# Patient Record
Sex: Male | Born: 1950 | Race: White | Hispanic: No | Marital: Married | Smoking: Never smoker
Health system: Southern US, Community
[De-identification: ages and names within clinical notes are randomized; demographics above are authoritative.]

## PROBLEM LIST (undated history)

## (undated) DIAGNOSIS — Z299 Encounter for prophylactic measures, unspecified: Secondary | ICD-10-CM

## (undated) DIAGNOSIS — O2230 Deep phlebothrombosis in pregnancy, unspecified trimester: Secondary | ICD-10-CM

## (undated) DIAGNOSIS — Z98811 Dental restoration status: Secondary | ICD-10-CM

## (undated) DIAGNOSIS — M2241 Chondromalacia patellae, right knee: Secondary | ICD-10-CM

## (undated) DIAGNOSIS — S83249A Other tear of medial meniscus, current injury, unspecified knee, initial encounter: Secondary | ICD-10-CM

## (undated) DIAGNOSIS — I82409 Acute embolism and thrombosis of unspecified deep veins of unspecified lower extremity: Secondary | ICD-10-CM

## (undated) DIAGNOSIS — I890 Lymphedema, not elsewhere classified: Secondary | ICD-10-CM

## (undated) DIAGNOSIS — IMO0001 Reserved for inherently not codable concepts without codable children: Secondary | ICD-10-CM

## (undated) DIAGNOSIS — Z960 Presence of urogenital implants: Secondary | ICD-10-CM

## (undated) DIAGNOSIS — M1990 Unspecified osteoarthritis, unspecified site: Secondary | ICD-10-CM

## (undated) DIAGNOSIS — C801 Malignant (primary) neoplasm, unspecified: Secondary | ICD-10-CM

## (undated) DIAGNOSIS — N400 Enlarged prostate without lower urinary tract symptoms: Secondary | ICD-10-CM

## (undated) HISTORY — PX: COLONOSCOPY: SHX174

## (undated) HISTORY — PX: HERNIA REPAIR: SHX51

## (undated) HISTORY — PX: SCROTAL EXPLORATION: SHX2386

## (undated) HISTORY — PX: PROSTATECTOMY: SHX69

## (undated) HISTORY — PX: URINARY SPHINCTER IMPLANT: SHX2624

## (undated) HISTORY — PX: VASECTOMY: SHX75

---

## 2002-08-29 ENCOUNTER — Ambulatory Visit (HOSPITAL_COMMUNITY): Admission: RE | Admit: 2002-08-29 | Discharge: 2002-08-29 | Payer: Self-pay | Admitting: Gastroenterology

## 2008-02-28 ENCOUNTER — Ambulatory Visit (HOSPITAL_COMMUNITY): Admission: RE | Admit: 2008-02-28 | Discharge: 2008-02-28 | Payer: Self-pay | Admitting: Gastroenterology

## 2011-03-25 NOTE — Op Note (Signed)
   NAME:  Isaiah Todd, Isaiah Todd                         ACCOUNT NO.:  [id number]   MEDICAL RECORD NO.:  [id number]                   PATIENT TYPE:  AMB   LOCATION:  ENDO                                 FACILITY:  [facility]   PHYSICIAN:  Danise Edge, M.D.                DATE OF BIRTH:  [birth year]   DATE OF PROCEDURE:  08/29/2002  DATE OF DISCHARGE:                                 OPERATIVE REPORT   PROCEDURE PERFORMED:  Colonoscopy.   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  His father died of adenocarcinoma of unknown primary.  His father underwent colonoscopic exams to remove colon polyps.  There was  no definite family history of colon cancer.   ENDOSCOPIST:  Danise Edge, M.D.   PREMEDICATION:  Versed 10 mg, Fentanyl 50 mcg   ENDOSCOPE:  Olympus pediatric colonoscope.   DESCRIPTION OF PROCEDURE:  After obtaining informed consent, the patient was  placed in the left lateral decubitus position.  I administered intravenous  Fentanyl and intravenous Versed to achieve conscious sedation for the  procedure.  The patient's blood pressure, oxygen saturation, and cardiac  rhythm were monitored throughout the procedure and documented in the medical  record.   Anal inspection was normal.  Digital rectal exam revealed a nonnodular  prostate.  The Olympus pediatric video colonoscope was introduced into the  rectum and easily advanced to the cecum.  Colonic preparation for the exam  today was excellent.  The patient consumed the MiraLax - Gatorade colonic  prep.   RECTUM:  Normal.   SIGMOID COLON AND DESCENDING COLON:  Normal.   SPLENIC FLEXURE:  Normal.   TRANSVERSE COLON:  Normal.   HEPATIC FLEXURE:  Normal.   ASCENDING COLON:  Normal.   CECUM AND ILEOCECAL VALVE:  Normal.    ASSESSMENT:  Normal screening proctocolonoscopy to the cecum.  No endoscopic  evidence for the presence of colorectal neoplasia.                                          Danise Edge, M.D.    MJ/MEDQ  D:  08/29/2002  T:  08/29/2002  Job:  [id number]   cc:   Hal T. Stoneking, M.D.  [address] [place], [place] [address]  Fax: [phone]

## 2015-03-30 ENCOUNTER — Other Ambulatory Visit: Payer: Self-pay | Admitting: Physician Assistant

## 2015-04-07 ENCOUNTER — Encounter (HOSPITAL_BASED_OUTPATIENT_CLINIC_OR_DEPARTMENT_OTHER): Payer: Self-pay | Admitting: *Deleted

## 2015-04-08 NOTE — H&P (Signed)
[facility] [address] [place], [place] [address] [phone] A Division of [facility]  Ninetta Lights, M.D.   Robert A. Noemi Chapel, M.D.   Faythe Casa, M.D.   Johnny Bridge, M.D.   Almedia Balls, M.D. Ernesta Amble. Percell Miller, M.D.  Joseph Pierini, M.D.  Lanier Prude, M.D.    Verner Chol, M.D. Lovett Calender, PA- C  Mary L. Venida Jarvis, PA-C  Kirstin A. Shepperson, PA-C  Josh Pinesdale, PA-C  [place], [place]  RE: Isaiah Todd, [place]   [id number]      DOB: 12-09-1950 INITIAL EVALUATION:  03-17-15  HPI: The patient is a 64 year old, new patient to the office today presenting with left knee pain.  He was referred by Dr. Lajean Manes from [place] for left knee pain.  This has been going on for approximately 5 months now with no provoking injury or trauma.  He does report a day in December when he was climbing a ladder multiple times and potentially twisted his knee at that time.  He currently describes symptoms of intermittent swelling,  popping, catching, locking, and giving way.  Most of his pain is throbbing in nature.  He has been treating with anti-inflammatories up to 600 mg. q.d. with no significant relief of his pain but mild improvement.  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    The patient's past medical history is significant for BPH, erectile dysfunction, inguinal hernia repair, and previous history of back pain.  Medications include finasteride, Ibuprofen and Cialis.  Family history is negative for diabetes, positive for hypertension, negative for heart disease.  The patient is a nonsmoker, occasional drinker and is retired.    EXAMINATION: The patient is 5'6", 205 pounds, blood pressure 131/90, P 65.  Examination of his left knee reveals medial joint line tenderness to palpation.  No lateral joint line tenderness.  No significant retropatellar grind.  Range of motion  between 0 and 120 degrees.  Ligamentous structures are intact with solid end-points.  Positive McMurray's and Thessaly's concerning for meniscal pathology and internal derangement.  No effusion or erythema present on exam.  Neurovascularly intact distally.  Muscle function is intact with normal knee flexion and extension.    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     DISPOSITION: Discussed at length with the patient that based on his clinical history and exam of mechanical   Continued----- [facility] [address] [place], [place] [id number] [phone] A Division of [facility]  Ninetta Lights, M.D.   Robert A. Noemi Chapel, M.D.   Faythe Casa, M.D.   Johnny Bridge, M.D.   Almedia Balls, M.D. Ernesta Amble. Percell Miller, M.D.  Joseph Pierini, M.D.  Lanier Prude, M.D.    Verner Chol, M.D. Lovett Calender, PA- C  Mary L. Venida Jarvis, PA-C  Kirstin A. Shepperson, PA-C  Josh Yoncalla, PA-C  [place], [place]  RE: Isaiah Todd, Isaiah Todd   [id number]      DOB: 1951-10-15 INITIAL EVALUATION:  03-17-15 Page 2  symptoms with constant left knee pain as well as intermittent effusions and signs of internal derangement on exam, we suspect he has a meniscus tear that we can further classify with  MRI.  The patient will also be scheduled for surgery and will proceed based on MRI results.  The patient was agreeable with this plan. He was provided with appropriate counseling about surgical intervention.    PLEASE CALL MRI RESULTS TO PATIENT AT [phone].   Ninetta Lights, M.D.  Dictated by:  Birdie Hopes, DO   Electronically verified by Ninetta Lights, M.D.  DFM(DD):gde D 03-17-15 T 03-18-15 Cc:  Lajean Manes, MD fax [phone]

## 2015-04-09 ENCOUNTER — Ambulatory Visit (HOSPITAL_BASED_OUTPATIENT_CLINIC_OR_DEPARTMENT_OTHER): Payer: 59 | Admitting: Anesthesiology

## 2015-04-09 ENCOUNTER — Ambulatory Visit (HOSPITAL_BASED_OUTPATIENT_CLINIC_OR_DEPARTMENT_OTHER)
Admission: RE | Admit: 2015-04-09 | Discharge: 2015-04-09 | Disposition: A | Discharge status: Home / Self Care | Payer: 59 | Source: Ambulatory Visit | Attending: Orthopedic Surgery | Admitting: Orthopedic Surgery

## 2015-04-09 ENCOUNTER — Encounter (HOSPITAL_BASED_OUTPATIENT_CLINIC_OR_DEPARTMENT_OTHER): Payer: Self-pay | Admitting: Anesthesiology

## 2015-04-09 ENCOUNTER — Encounter (HOSPITAL_BASED_OUTPATIENT_CLINIC_OR_DEPARTMENT_OTHER)
Admission: RE | Disposition: A | Discharge status: Home / Self Care | Payer: Self-pay | Source: Ambulatory Visit | Attending: Orthopedic Surgery

## 2015-04-09 DIAGNOSIS — M23232 Derangement of other medial meniscus due to old tear or injury, left knee: Secondary | ICD-10-CM | POA: Diagnosis not present

## 2015-04-09 DIAGNOSIS — M2242 Chondromalacia patellae, left knee: Secondary | ICD-10-CM | POA: Insufficient documentation

## 2015-04-09 DIAGNOSIS — N400 Enlarged prostate without lower urinary tract symptoms: Secondary | ICD-10-CM | POA: Diagnosis not present

## 2015-04-09 DIAGNOSIS — E669 Obesity, unspecified: Secondary | ICD-10-CM | POA: Diagnosis not present

## 2015-04-09 DIAGNOSIS — Z6833 Body mass index (BMI) 33.0-33.9, adult: Secondary | ICD-10-CM | POA: Insufficient documentation

## 2015-04-09 DIAGNOSIS — M23204 Derangement of unspecified medial meniscus due to old tear or injury, left knee: Secondary | ICD-10-CM | POA: Diagnosis present

## 2015-04-09 HISTORY — DX: Benign prostatic hyperplasia without lower urinary tract symptoms: N40.0

## 2015-04-09 HISTORY — PX: CHONDROPLASTY: SHX5177

## 2015-04-09 HISTORY — PX: KNEE ARTHROSCOPY WITH MEDIAL MENISECTOMY: SHX5651

## 2015-04-09 LAB — POCT HEMOGLOBIN-HEMACUE: Hemoglobin: 16.1 g/dL (ref 13.0–17.0)

## 2015-04-09 SURGERY — ARTHROSCOPY, KNEE, WITH MEDIAL MENISCECTOMY
Anesthesia: General | Site: Knee | Laterality: Left

## 2015-04-09 MED ORDER — KETOROLAC TROMETHAMINE 30 MG/ML IJ SOLN
INTRAMUSCULAR | Status: DC | PRN
  Administered 2015-04-09: 30 mg via INTRAVENOUS

## 2015-04-09 MED ORDER — LACTATED RINGERS IV SOLN
INTRAVENOUS | Status: DC
  Administered 2015-04-09 (×2): via INTRAVENOUS

## 2015-04-09 MED ORDER — ONDANSETRON HCL 4 MG PO TABS: 4.0000 mg | ORAL_TABLET | Freq: Three times a day (TID) | ORAL | Status: DC | PRN

## 2015-04-09 MED ORDER — ACETAMINOPHEN-CODEINE #3 300-30 MG PO TABS: 1.0000 | ORAL_TABLET | ORAL | Status: DC | PRN

## 2015-04-09 MED ORDER — FENTANYL CITRATE (PF) 100 MCG/2ML IJ SOLN
INTRAMUSCULAR | Status: DC | PRN
  Administered 2015-04-09 (×3): 50 ug via INTRAVENOUS

## 2015-04-09 MED ORDER — METHYLPREDNISOLONE ACETATE 80 MG/ML IJ SUSP
INTRAMUSCULAR | Status: AC
  Filled 2015-04-09: qty 1

## 2015-04-09 MED ORDER — LIDOCAINE HCL (CARDIAC) 20 MG/ML IV SOLN
INTRAVENOUS | Status: DC | PRN
  Administered 2015-04-09: 50 mg via INTRAVENOUS

## 2015-04-09 MED ORDER — HYDROMORPHONE HCL 1 MG/ML IJ SOLN: 0.2500 mg | INTRAMUSCULAR | Status: DC | PRN

## 2015-04-09 MED ORDER — MIDAZOLAM HCL 5 MG/5ML IJ SOLN
INTRAMUSCULAR | Status: DC | PRN
  Administered 2015-04-09: 2 mg via INTRAVENOUS

## 2015-04-09 MED ORDER — PROPOFOL 500 MG/50ML IV EMUL
INTRAVENOUS | Status: AC
  Filled 2015-04-09: qty 50

## 2015-04-09 MED ORDER — FENTANYL CITRATE (PF) 100 MCG/2ML IJ SOLN
INTRAMUSCULAR | Status: AC
  Filled 2015-04-09: qty 6

## 2015-04-09 MED ORDER — SODIUM CHLORIDE 0.9 % IR SOLN
Status: DC | PRN
  Administered 2015-04-09: 4500 mL

## 2015-04-09 MED ORDER — METHYLPREDNISOLONE ACETATE 80 MG/ML IJ SUSP
INTRAMUSCULAR | Status: DC | PRN
  Administered 2015-04-09: 80 mg

## 2015-04-09 MED ORDER — OXYCODONE HCL 5 MG/5ML PO SOLN: 5.0000 mg | Freq: Once | ORAL | Status: DC | PRN

## 2015-04-09 MED ORDER — PROPOFOL 10 MG/ML IV BOLUS
INTRAVENOUS | Status: DC | PRN
  Administered 2015-04-09: 200 mg via INTRAVENOUS

## 2015-04-09 MED ORDER — PROPOFOL 10 MG/ML IV BOLUS
INTRAVENOUS | Status: AC
  Filled 2015-04-09: qty 80

## 2015-04-09 MED ORDER — CEFAZOLIN SODIUM-DEXTROSE 2-3 GM-% IV SOLR
INTRAVENOUS | Status: AC
  Filled 2015-04-09: qty 50

## 2015-04-09 MED ORDER — LACTATED RINGERS IV SOLN
INTRAVENOUS | Status: DC
  Administered 2015-04-09: 08:00:00 via INTRAVENOUS

## 2015-04-09 MED ORDER — BUPIVACAINE HCL (PF) 0.5 % IJ SOLN
INTRAMUSCULAR | Status: AC
  Filled 2015-04-09: qty 30

## 2015-04-09 MED ORDER — ONDANSETRON HCL 4 MG/2ML IJ SOLN: 4.0000 mg | Freq: Four times a day (QID) | INTRAMUSCULAR | Status: DC | PRN

## 2015-04-09 MED ORDER — ONDANSETRON HCL 4 MG/2ML IJ SOLN
INTRAMUSCULAR | Status: DC | PRN
  Administered 2015-04-09: 4 mg via INTRAVENOUS

## 2015-04-09 MED ORDER — BUPIVACAINE HCL (PF) 0.5 % IJ SOLN
INTRAMUSCULAR | Status: DC | PRN
  Administered 2015-04-09: 20 mL via INTRA_ARTICULAR

## 2015-04-09 MED ORDER — MIDAZOLAM HCL 2 MG/2ML IJ SOLN
1.0000 mg | INTRAMUSCULAR | Status: DC | PRN
Start: 2015-04-09 — End: 2015-04-09

## 2015-04-09 MED ORDER — GLYCOPYRROLATE 0.2 MG/ML IJ SOLN: 0.2000 mg | Freq: Once | INTRAMUSCULAR | Status: DC | PRN

## 2015-04-09 MED ORDER — MIDAZOLAM HCL 2 MG/2ML IJ SOLN
INTRAMUSCULAR | Status: AC
  Filled 2015-04-09: qty 2

## 2015-04-09 MED ORDER — CEFAZOLIN SODIUM-DEXTROSE 2-3 GM-% IV SOLR
2.0000 g | INTRAVENOUS | Status: AC
  Administered 2015-04-09: 2 g via INTRAVENOUS

## 2015-04-09 MED ORDER — FENTANYL CITRATE (PF) 100 MCG/2ML IJ SOLN: 50.0000 ug | INTRAMUSCULAR | Status: DC | PRN

## 2015-04-09 MED ORDER — CHLORHEXIDINE GLUCONATE 4 % EX LIQD: 60.0000 mL | Freq: Once | CUTANEOUS | Status: DC

## 2015-04-09 MED ORDER — DEXAMETHASONE SODIUM PHOSPHATE 4 MG/ML IJ SOLN
INTRAMUSCULAR | Status: DC | PRN
  Administered 2015-04-09: 10 mg via INTRAVENOUS

## 2015-04-09 MED ORDER — OXYCODONE HCL 5 MG PO TABS: 5.0000 mg | ORAL_TABLET | Freq: Once | ORAL | Status: DC | PRN

## 2015-04-09 SURGICAL SUPPLY — 39 items
BANDAGE ELASTIC 6 VELCRO ST LF (GAUZE/BANDAGES/DRESSINGS) ×2 IMPLANT
BLADE CUDA 5.5 (BLADE) IMPLANT
BLADE CUDA GRT WHITE 3.5 (BLADE) IMPLANT
BLADE CUTTER GATOR 3.5 (BLADE) ×2 IMPLANT
BLADE CUTTER MENIS 5.5 (BLADE) IMPLANT
BLADE GREAT WHITE 4.2 (BLADE) ×2 IMPLANT
BUR OVAL 4.0 (BURR) IMPLANT
CUTTER MENISCUS  4.2MM (BLADE)
CUTTER MENISCUS 4.2MM (BLADE) IMPLANT
DRAPE ARTHROSCOPY W/POUCH 90 (DRAPES) ×2 IMPLANT
DURAPREP 26ML APPLICATOR (WOUND CARE) ×2 IMPLANT
ELECT MENISCUS 165MM 90D (ELECTRODE) IMPLANT
ELECT REM PT RETURN 9FT ADLT (ELECTROSURGICAL)
ELECTRODE REM PT RTRN 9FT ADLT (ELECTROSURGICAL) IMPLANT
GAUZE SPONGE 4X4 12PLY STRL (GAUZE/BANDAGES/DRESSINGS) ×4 IMPLANT
GAUZE XEROFORM 1X8 LF (GAUZE/BANDAGES/DRESSINGS) ×2 IMPLANT
GLOVE BIOGEL PI IND STRL 7.0 (GLOVE) ×3 IMPLANT
GLOVE BIOGEL PI INDICATOR 7.0 (GLOVE) ×3
GLOVE ECLIPSE 6.5 STRL STRAW (GLOVE) ×2 IMPLANT
GLOVE ECLIPSE 7.0 STRL STRAW (GLOVE) ×2 IMPLANT
GLOVE ORTHO TXT STRL SZ7.5 (GLOVE) IMPLANT
GLOVE SURG ORTHO 8.0 STRL STRW (GLOVE) ×2 IMPLANT
GOWN STRL REUS W/ TWL LRG LVL3 (GOWN DISPOSABLE) ×2 IMPLANT
GOWN STRL REUS W/ TWL XL LVL3 (GOWN DISPOSABLE) ×1 IMPLANT
GOWN STRL REUS W/TWL LRG LVL3 (GOWN DISPOSABLE) ×2
GOWN STRL REUS W/TWL XL LVL3 (GOWN DISPOSABLE) ×1
HOLDER KNEE FOAM BLUE (MISCELLANEOUS) ×2 IMPLANT
IV NS IRRIG 3000ML ARTHROMATIC (IV SOLUTION) ×8 IMPLANT
KNEE WRAP E Z 3 GEL PACK (MISCELLANEOUS) ×2 IMPLANT
MANIFOLD NEPTUNE II (INSTRUMENTS) ×2 IMPLANT
PACK ARTHROSCOPY DSU (CUSTOM PROCEDURE TRAY) ×2 IMPLANT
PACK BASIN DAY SURGERY FS (CUSTOM PROCEDURE TRAY) ×2 IMPLANT
PENCIL BUTTON HOLSTER BLD 10FT (ELECTRODE) IMPLANT
SET ARTHROSCOPY TUBING (MISCELLANEOUS) ×1
SET ARTHROSCOPY TUBING LN (MISCELLANEOUS) ×1 IMPLANT
SUT ETHILON 3 0 PS 1 (SUTURE) ×2 IMPLANT
SUT VIC AB 3-0 FS2 27 (SUTURE) IMPLANT
TOWEL OR 17X24 6PK STRL BLUE (TOWEL DISPOSABLE) ×2 IMPLANT
WATER STERILE IRR 1000ML POUR (IV SOLUTION) ×2 IMPLANT

## 2015-04-09 NOTE — Anesthesia Procedure Notes (Signed)
Procedure Name: LMA Insertion Date/Time: 04/09/2015 8:27 AM Performed by: Toula Moos L Pre-anesthesia Checklist: Patient identified, Emergency Drugs available, Suction available, Patient being monitored and Timeout performed Patient Re-evaluated:Patient Re-evaluated prior to inductionOxygen Delivery Method: Circle System Utilized Preoxygenation: Pre-oxygenation with 100% oxygen Intubation Type: IV induction Ventilation: Mask ventilation without difficulty LMA: LMA inserted LMA Size: 5.0 Number of attempts: 1 Airway Equipment and Method: Bite block Placement Confirmation: positive ETCO2 Tube secured with: Tape Dental Injury: Teeth and Oropharynx as per pre-operative assessment

## 2015-04-09 NOTE — Interval H&P Note (Signed)
History and Physical Interval Note:  04/09/2015 7:38 AM  Isaiah Todd  has presented today for surgery, with the diagnosis of CHONDROMALACIA PATELLAE LEFT KNEE OTHER MENISCUS DERANGEMENTS POSTERIOR HORN OF MEDIAL MENISCUS LEFT KNEE OTHER TEAR OF LATERAL MENSICUS CURRENT INJURY UNSPECIFIED KNEE INITIAL   The various methods of treatment have been discussed with the patient and family. After consideration of risks, benefits and other options for treatment, the patient has consented to  Procedure(s): LEFT KNEE ARTHROSCOPY CHONDROPLASTY, MEDIAL AND LATERAL  MENISCECTOMY (Left) as a surgical intervention .  The patient's history has been reviewed, patient examined, no change in status, stable for surgery.  I have reviewed the patient's chart and labs.  Questions were answered to the patient's satisfaction.     Jamica Woodyard F

## 2015-04-09 NOTE — Anesthesia Postprocedure Evaluation (Signed)
Anesthesia Post Note  Patient: Isaiah Todd  Procedure(s) Performed: Procedure(s) (LRB): LEFT KNEE ARTHROSCOPY CHONDROPLASTY, PARIAL MEDIAL   MENISCECTOMY (Left) CHONDROPLASTY (Left)  Anesthesia type: General  Patient location: PACU  Post pain: Pain level controlled and Adequate analgesia  Post assessment: Post-op Vital signs reviewed, Patient's Cardiovascular Status Stable, Respiratory Function Stable, Patent Airway and Pain level controlled  Last Vitals:  Filed Vitals:   04/09/15 0945  BP: 111/64  Pulse:   Temp:   Resp:     Post vital signs: Reviewed and stable  Level of consciousness: awake, alert  and oriented  Complications: No apparent anesthesia complications

## 2015-04-09 NOTE — Transfer of Care (Signed)
Immediate Anesthesia Transfer of Care Note  Patient: Isaiah Todd  Procedure(s) Performed: Procedure(s): LEFT KNEE ARTHROSCOPY CHONDROPLASTY, PARIAL MEDIAL   MENISCECTOMY (Left) CHONDROPLASTY (Left)  Patient Location: PACU  Anesthesia Type:General  Level of Consciousness: sedated  Airway & Oxygen Therapy: Patient Spontanous Breathing and Patient connected to face mask oxygen  Post-op Assessment: Report given to RN and Post -op Vital signs reviewed and stable  Post vital signs: Reviewed and stable  Last Vitals:  Filed Vitals:   04/09/15 0723  BP: 133/82  Pulse: 67  Temp: 36.7 C  Resp: 20    Complications: No apparent anesthesia complications

## 2015-04-09 NOTE — Discharge Instructions (Signed)

## 2015-04-09 NOTE — Anesthesia Preprocedure Evaluation (Signed)
Anesthesia Evaluation  Patient identified by MRN, date of birth, ID band Patient awake    Reviewed: Allergy & Precautions, NPO status , Patient's Chart, lab work & pertinent test results  Airway Mallampati: II   Neck ROM: full    Dental   Pulmonary neg pulmonary ROS,  breath sounds clear to auscultation        Cardiovascular negative cardio ROS  Rhythm:regular Rate:Normal     Neuro/Psych    GI/Hepatic   Endo/Other  obese  Renal/GU    BPH    Musculoskeletal   Abdominal   Peds  Hematology   Anesthesia Other Findings   Reproductive/Obstetrics                             Anesthesia Physical Anesthesia Plan  ASA: II  Anesthesia Plan: General   Post-op Pain Management:    Induction: Intravenous  Airway Management Planned: LMA  Additional Equipment:   Intra-op Plan:   Post-operative Plan:   Informed Consent: I have reviewed the patients History and Physical, chart, labs and discussed the procedure including the risks, benefits and alternatives for the proposed anesthesia with the patient or authorized representative who has indicated his/her understanding and acceptance.     Plan Discussed with: CRNA, Anesthesiologist and Surgeon  Anesthesia Plan Comments:         Anesthesia Quick Evaluation

## 2015-04-10 ENCOUNTER — Encounter (HOSPITAL_BASED_OUTPATIENT_CLINIC_OR_DEPARTMENT_OTHER): Payer: Self-pay | Admitting: Orthopedic Surgery

## 2015-04-10 NOTE — Op Note (Signed)
NAMESEMISI, BIELA NO.:  [id number]  MEDICAL RECORD NO.:  [id number]  LOCATION:                                FACILITY:  [facility]  PHYSICIAN:  Ninetta Lights, M.D. DATE OF BIRTH:  05/28/1951  DATE OF PROCEDURE:  04/09/2015 DATE OF DISCHARGE:  04/09/2015                              OPERATIVE REPORT   PREOPERATIVE DIAGNOSIS:  Left knee medial meniscus tear.  POSTOPERATIVE DIAGNOSIS:  Left knee medial meniscus tear, complex tear medial meniscus.  Some focal grade 3 changes medial femoral condyle, grade 2 patellofemoral joint.  PROCEDURE:  Left knee exam under anesthesia, arthroscopy.  Chondroplasty patella medial femoral condyle.  Partial medial meniscectomy.  SURGEON:  Ninetta Lights, M.D.  ASSISTANT:  Elmyra Ricks, P.A.  ANESTHESIA:  General.  BLOOD LOSS:  Minimal.  SPECIMENS:  None.  CULTURES:  None.  COMPLICATIONS:  None.  DRESSINGS:  Soft compressive.  TOURNIQUET:  Not employed.  PROCEDURE:  The patient was brought to the operating room, placed on the operating room table in a supine position.  After adequate anesthesia had been obtained, leg holder applied.  Leg prepped and draped in usual sterile fashion.  Two portals, one each medial and lateral, parapatellar.  Arthroscope introduced, knee distended and inspected. Good patellar tracking.  Some mild grade 2 changes debrided.  ACL intact.  Lateral meniscus and lateral compartment looked good.  Medial meniscus extensive complex tearing.  Focal area of grade 2 and 3 changes on the condyle juxtaposed to the posterior horn tear.  After taking the meniscus out to a stable rim, tapered in smoothly, I then did a chondroplasty of the condyle.  Numerous small chondral loose bodies were removed.  The entire knee examined.  No other findings were appreciated.  Instruments were fully removed.  Portals were closed with nylon.  Knee injected intra- articularly with Depo-Medrol and Marcaine.   Anesthesia reversed. Brought to the recovery room.  Tolerated the surgery well.  No complications.     Ninetta Lights, M.D.     DFM/MEDQ  D:  04/09/2015  T:  04/10/2015  Job:  [id number]

## 2015-08-08 DIAGNOSIS — M2241 Chondromalacia patellae, right knee: Secondary | ICD-10-CM

## 2015-08-08 DIAGNOSIS — S83249A Other tear of medial meniscus, current injury, unspecified knee, initial encounter: Secondary | ICD-10-CM

## 2015-08-08 HISTORY — DX: Other tear of medial meniscus, current injury, unspecified knee, initial encounter: S83.249A

## 2015-08-08 HISTORY — DX: Chondromalacia patellae, right knee: M22.41

## 2015-08-18 ENCOUNTER — Other Ambulatory Visit: Payer: Self-pay | Admitting: Physician Assistant

## 2015-08-18 NOTE — H&P (Signed)
This is a pleasant 64 year-old gentleman who presents to our clinic today with right knee pain.  [name] has struggled on and off with intermittent pain, most likely from a degenerative tear, over the past several months.  In the past this has not been bad enough to proceed with further imaging studies or surgical intervention.  However, he notes that on July 22, 2015 he was jumping off his pickup truck when he hyperextended his right knee.  Since then he has had sharp burning pain in the medial aspect of that knee.  He has noted moderate instability, as well as locking and catching as well.  Increased startup stiffness and rest pain and night pain.  He has tried ice, as well as Percocet with minimal relief of symptoms.  Of note, he is status post left knee arthroscopy with debridement of meniscus tear back in June of 2016.  He has done exceptionally well with this.   Past medical, social and family history reviewed in detail on the patient questionnaire and signed.  Review of systems: As detailed in HPI.  All others reviewed and are negative.    EXAMINATION: Well-developed, well-nourished male in no acute distress.  Alert and oriented x 3.  Examination of his left knee reveals range of motion 0-120 degrees.  No joint line tenderness.  Moderate patellofemoral crepitus.  Ligaments are stable.  He is neurovascularly intact distally.  Examination of his right knee reveals range of motion 0-120 degrees.  Trace effusion.  Moderate patellofemoral crepitus.  I can't get him to open at all with valgus or varus stress.  No pain with valgus or varus stress.  He does have medial joint line tenderness and positive medial McMurray.  Negative Lachman.  He is neurovascularly intact distally.    IMPRESSION: 1. Status post left knee arthroscopy. 2. Right knee medial meniscus tear.  PLAN: In regards to [name]'s left knee, we are pleased he is doing so well.  In regards to his right knee, we are going to go ahead and  proceed with an MRI to assess his meniscus.  We are going to call him once this is complete.  We are going to go ahead and fill out paperwork today for right knee arthroscopy.  Risks, benefits and possible complications of surgery have been reviewed.  Rehab and recovery time discussed. All questions answered.  Paperwork complete.  [name], PA-C   Addendum: MRI results reflect medial meniscus tear right knee

## 2015-08-20 ENCOUNTER — Encounter (HOSPITAL_BASED_OUTPATIENT_CLINIC_OR_DEPARTMENT_OTHER): Payer: Self-pay | Admitting: *Deleted

## 2015-08-26 NOTE — Anesthesia Preprocedure Evaluation (Addendum)
Anesthesia Evaluation  Patient identified by MRN, date of birth, ID band Patient awake    Reviewed: Allergy & Precautions, NPO status , Patient's Chart, lab work & pertinent test results  History of Anesthesia Complications Negative for: history of anesthetic complications  Airway Mallampati: II  TM Distance: >3 FB Neck ROM: Full    Dental no notable dental hx. (+) Dental Advisory Given   Pulmonary neg pulmonary ROS,    Pulmonary exam normal breath sounds clear to auscultation       Cardiovascular negative cardio ROS Normal cardiovascular exam Rhythm:Regular Rate:Normal     Neuro/Psych negative neurological ROS  negative psych ROS   GI/Hepatic negative GI ROS, Neg liver ROS,   Endo/Other  obesity  Renal/GU negative Renal ROS  negative genitourinary   Musculoskeletal  (+) Arthritis ,   Abdominal   Peds negative pediatric ROS (+)  Hematology negative hematology ROS (+)   Anesthesia Other Findings   Reproductive/Obstetrics negative OB ROS                            Anesthesia Physical Anesthesia Plan  ASA: II  Anesthesia Plan: General   Post-op Pain Management:    Induction: Intravenous  Airway Management Planned: LMA  Additional Equipment:   Intra-op Plan:   Post-operative Plan: Extubation in OR  Informed Consent: I have reviewed the patients History and Physical, chart, labs and discussed the procedure including the risks, benefits and alternatives for the proposed anesthesia with the patient or authorized representative who has indicated his/her understanding and acceptance.   Dental advisory given  Plan Discussed with: Anesthesiologist and CRNA  Anesthesia Plan Comments:        Anesthesia Quick Evaluation

## 2015-08-27 ENCOUNTER — Ambulatory Visit (HOSPITAL_BASED_OUTPATIENT_CLINIC_OR_DEPARTMENT_OTHER)
Admission: RE | Admit: 2015-08-27 | Discharge: 2015-08-27 | Disposition: A | Discharge status: Home / Self Care | Payer: 59 | Source: Ambulatory Visit | Attending: Orthopedic Surgery | Admitting: Orthopedic Surgery

## 2015-08-27 ENCOUNTER — Ambulatory Visit (HOSPITAL_BASED_OUTPATIENT_CLINIC_OR_DEPARTMENT_OTHER): Payer: 59 | Admitting: Anesthesiology

## 2015-08-27 ENCOUNTER — Encounter (HOSPITAL_BASED_OUTPATIENT_CLINIC_OR_DEPARTMENT_OTHER)
Admission: RE | Disposition: A | Discharge status: Home / Self Care | Payer: Self-pay | Source: Ambulatory Visit | Attending: Orthopedic Surgery

## 2015-08-27 ENCOUNTER — Encounter (HOSPITAL_BASED_OUTPATIENT_CLINIC_OR_DEPARTMENT_OTHER): Payer: Self-pay

## 2015-08-27 DIAGNOSIS — M23303 Other meniscus derangements, unspecified medial meniscus, right knee: Secondary | ICD-10-CM | POA: Insufficient documentation

## 2015-08-27 DIAGNOSIS — S83241A Other tear of medial meniscus, current injury, right knee, initial encounter: Secondary | ICD-10-CM | POA: Diagnosis present

## 2015-08-27 HISTORY — DX: Dental restoration status: Z98.811

## 2015-08-27 HISTORY — DX: Unspecified osteoarthritis, unspecified site: M19.90

## 2015-08-27 HISTORY — DX: Other tear of medial meniscus, current injury, unspecified knee, initial encounter: S83.249A

## 2015-08-27 HISTORY — DX: Chondromalacia patellae, right knee: M22.41

## 2015-08-27 HISTORY — PX: KNEE ARTHROSCOPY WITH MEDIAL MENISECTOMY: SHX5651

## 2015-08-27 SURGERY — ARTHROSCOPY, KNEE, WITH MEDIAL MENISCECTOMY
Anesthesia: General | Site: Knee | Laterality: Right

## 2015-08-27 MED ORDER — METOCLOPRAMIDE HCL 5 MG/ML IJ SOLN: 5.0000 mg | Freq: Three times a day (TID) | INTRAMUSCULAR | Status: DC | PRN

## 2015-08-27 MED ORDER — ONDANSETRON HCL 4 MG/2ML IJ SOLN
INTRAMUSCULAR | Status: DC | PRN
  Administered 2015-08-27: 4 mg via INTRAVENOUS

## 2015-08-27 MED ORDER — METHOCARBAMOL 1000 MG/10ML IJ SOLN: 500.0000 mg | Freq: Four times a day (QID) | INTRAVENOUS | Status: DC | PRN

## 2015-08-27 MED ORDER — KETOROLAC TROMETHAMINE 30 MG/ML IJ SOLN
INTRAMUSCULAR | Status: AC
  Filled 2015-08-27: qty 1

## 2015-08-27 MED ORDER — OXYCODONE-ACETAMINOPHEN 5-325 MG PO TABS: 1.0000 | ORAL_TABLET | ORAL | Status: DC | PRN

## 2015-08-27 MED ORDER — MIDAZOLAM HCL 2 MG/2ML IJ SOLN
INTRAMUSCULAR | Status: AC
  Filled 2015-08-27: qty 4

## 2015-08-27 MED ORDER — METHYLPREDNISOLONE ACETATE 80 MG/ML IJ SUSP
INTRAMUSCULAR | Status: DC | PRN
  Administered 2015-08-27: 80 mg via INTRA_ARTICULAR

## 2015-08-27 MED ORDER — ONDANSETRON HCL 4 MG PO TABS: 4.0000 mg | ORAL_TABLET | Freq: Three times a day (TID) | ORAL | Status: DC | PRN

## 2015-08-27 MED ORDER — MIDAZOLAM HCL 2 MG/2ML IJ SOLN
1.0000 mg | INTRAMUSCULAR | Status: DC | PRN
  Administered 2015-08-27: 2 mg via INTRAVENOUS

## 2015-08-27 MED ORDER — SUCCINYLCHOLINE CHLORIDE 20 MG/ML IJ SOLN
INTRAMUSCULAR | Status: AC
  Filled 2015-08-27: qty 1

## 2015-08-27 MED ORDER — DEXAMETHASONE SODIUM PHOSPHATE 10 MG/ML IJ SOLN
INTRAMUSCULAR | Status: AC
Start: 2015-08-27 — End: 2015-08-27
  Filled 2015-08-27: qty 1

## 2015-08-27 MED ORDER — FENTANYL CITRATE (PF) 100 MCG/2ML IJ SOLN
INTRAMUSCULAR | Status: AC
  Filled 2015-08-27: qty 4

## 2015-08-27 MED ORDER — GLYCOPYRROLATE 0.2 MG/ML IJ SOLN: 0.2000 mg | Freq: Once | INTRAMUSCULAR | Status: DC | PRN

## 2015-08-27 MED ORDER — METOCLOPRAMIDE HCL 5 MG PO TABS: 5.0000 mg | ORAL_TABLET | Freq: Three times a day (TID) | ORAL | Status: DC | PRN

## 2015-08-27 MED ORDER — ONDANSETRON HCL 4 MG PO TABS: 4.0000 mg | ORAL_TABLET | Freq: Four times a day (QID) | ORAL | Status: DC | PRN

## 2015-08-27 MED ORDER — LIDOCAINE HCL (CARDIAC) 20 MG/ML IV SOLN
INTRAVENOUS | Status: DC | PRN
  Administered 2015-08-27: 100 mg via INTRAVENOUS

## 2015-08-27 MED ORDER — SCOPOLAMINE 1 MG/3DAYS TD PT72: 1.0000 | MEDICATED_PATCH | Freq: Once | TRANSDERMAL | Status: DC | PRN

## 2015-08-27 MED ORDER — ONDANSETRON HCL 4 MG/2ML IJ SOLN
INTRAMUSCULAR | Status: AC
  Filled 2015-08-27: qty 2

## 2015-08-27 MED ORDER — DEXAMETHASONE SODIUM PHOSPHATE 4 MG/ML IJ SOLN
INTRAMUSCULAR | Status: DC | PRN
  Administered 2015-08-27: 10 mg via INTRAVENOUS

## 2015-08-27 MED ORDER — LIDOCAINE HCL (CARDIAC) 20 MG/ML IV SOLN
INTRAVENOUS | Status: AC
  Filled 2015-08-27: qty 5

## 2015-08-27 MED ORDER — HYDROCODONE-ACETAMINOPHEN 5-325 MG PO TABS: 1.0000 | ORAL_TABLET | ORAL | Status: DC | PRN

## 2015-08-27 MED ORDER — METHOCARBAMOL 500 MG PO TABS: 500.0000 mg | ORAL_TABLET | Freq: Four times a day (QID) | ORAL | Status: DC | PRN

## 2015-08-27 MED ORDER — BUPIVACAINE HCL (PF) 0.5 % IJ SOLN
INTRAMUSCULAR | Status: DC | PRN
  Administered 2015-08-27: 20 mL via INTRA_ARTICULAR

## 2015-08-27 MED ORDER — PROPOFOL 10 MG/ML IV BOLUS
INTRAVENOUS | Status: DC | PRN
  Administered 2015-08-27: 200 mg via INTRAVENOUS
  Administered 2015-08-27 (×2): 25 mg via INTRAVENOUS

## 2015-08-27 MED ORDER — CEFAZOLIN SODIUM-DEXTROSE 2-3 GM-% IV SOLR
INTRAVENOUS | Status: AC
  Filled 2015-08-27: qty 50

## 2015-08-27 MED ORDER — SODIUM CHLORIDE 0.9 % IR SOLN
Status: DC | PRN
  Administered 2015-08-27: 3000 mL

## 2015-08-27 MED ORDER — ONDANSETRON HCL 4 MG/2ML IJ SOLN: 4.0000 mg | Freq: Once | INTRAMUSCULAR | Status: DC | PRN

## 2015-08-27 MED ORDER — ONDANSETRON HCL 4 MG/2ML IJ SOLN: 4.0000 mg | Freq: Four times a day (QID) | INTRAMUSCULAR | Status: DC | PRN

## 2015-08-27 MED ORDER — FENTANYL CITRATE (PF) 100 MCG/2ML IJ SOLN
25.0000 ug | INTRAMUSCULAR | Status: DC | PRN
  Administered 2015-08-27: 50 ug via INTRAVENOUS

## 2015-08-27 MED ORDER — BUPIVACAINE HCL (PF) 0.5 % IJ SOLN
INTRAMUSCULAR | Status: AC
  Filled 2015-08-27: qty 30

## 2015-08-27 MED ORDER — FENTANYL CITRATE (PF) 100 MCG/2ML IJ SOLN
50.0000 ug | INTRAMUSCULAR | Status: AC | PRN
  Administered 2015-08-27 (×2): 25 ug via INTRAVENOUS
  Administered 2015-08-27: 100 ug via INTRAVENOUS

## 2015-08-27 MED ORDER — PROPOFOL 500 MG/50ML IV EMUL
INTRAVENOUS | Status: AC
  Filled 2015-08-27: qty 50

## 2015-08-27 MED ORDER — FENTANYL CITRATE (PF) 100 MCG/2ML IJ SOLN
INTRAMUSCULAR | Status: AC
  Filled 2015-08-27: qty 2

## 2015-08-27 MED ORDER — CHLORHEXIDINE GLUCONATE 4 % EX LIQD: 60.0000 mL | Freq: Once | CUTANEOUS | Status: DC

## 2015-08-27 MED ORDER — LACTATED RINGERS IV SOLN: INTRAVENOUS | Status: DC

## 2015-08-27 MED ORDER — LACTATED RINGERS IV SOLN
INTRAVENOUS | Status: DC
  Administered 2015-08-27 (×2): via INTRAVENOUS

## 2015-08-27 MED ORDER — KETOROLAC TROMETHAMINE 30 MG/ML IJ SOLN
INTRAMUSCULAR | Status: DC | PRN
Start: 2015-08-27 — End: 2015-08-27
  Administered 2015-08-27: 30 mg via INTRAVENOUS

## 2015-08-27 MED ORDER — METHYLPREDNISOLONE ACETATE 80 MG/ML IJ SUSP
INTRAMUSCULAR | Status: AC
  Filled 2015-08-27: qty 1

## 2015-08-27 MED ORDER — CEFAZOLIN SODIUM-DEXTROSE 2-3 GM-% IV SOLR: 2.0000 g | INTRAVENOUS | Status: DC

## 2015-08-27 SURGICAL SUPPLY — 41 items
BANDAGE ELASTIC 6 VELCRO ST LF (GAUZE/BANDAGES/DRESSINGS) ×2 IMPLANT
BLADE CUDA 5.5 (BLADE) IMPLANT
BLADE CUDA GRT WHITE 3.5 (BLADE) IMPLANT
BLADE CUTTER GATOR 3.5 (BLADE) ×2 IMPLANT
BLADE CUTTER MENIS 5.5 (BLADE) IMPLANT
BLADE GREAT WHITE 4.2 (BLADE) ×2 IMPLANT
BUR OVAL 4.0 (BURR) IMPLANT
CUTTER MENISCUS  4.2MM (BLADE)
CUTTER MENISCUS 4.2MM (BLADE) IMPLANT
DRAPE ARTHROSCOPY W/POUCH 90 (DRAPES) ×2 IMPLANT
DURAPREP 26ML APPLICATOR (WOUND CARE) ×2 IMPLANT
ELECT MENISCUS 165MM 90D (ELECTRODE) IMPLANT
ELECT REM PT RETURN 9FT ADLT (ELECTROSURGICAL) ×2
ELECTRODE REM PT RTRN 9FT ADLT (ELECTROSURGICAL) ×1 IMPLANT
GAUZE SPONGE 4X4 12PLY STRL (GAUZE/BANDAGES/DRESSINGS) ×2 IMPLANT
GAUZE XEROFORM 1X8 LF (GAUZE/BANDAGES/DRESSINGS) ×2 IMPLANT
GLOVE BIO SURGEON STRL SZ7 (GLOVE) ×2 IMPLANT
GLOVE BIOGEL PI IND STRL 7.0 (GLOVE) ×1 IMPLANT
GLOVE BIOGEL PI IND STRL 7.5 (GLOVE) ×2 IMPLANT
GLOVE BIOGEL PI INDICATOR 7.0 (GLOVE) ×1
GLOVE BIOGEL PI INDICATOR 7.5 (GLOVE) ×2
GLOVE ECLIPSE 7.0 STRL STRAW (GLOVE) ×2 IMPLANT
GLOVE EXAM NITRILE MD LF STRL (GLOVE) ×2 IMPLANT
GLOVE SURG ORTHO 8.0 STRL STRW (GLOVE) ×2 IMPLANT
GOWN STRL REUS W/ TWL LRG LVL3 (GOWN DISPOSABLE) ×2 IMPLANT
GOWN STRL REUS W/ TWL XL LVL3 (GOWN DISPOSABLE) ×1 IMPLANT
GOWN STRL REUS W/TWL LRG LVL3 (GOWN DISPOSABLE) ×2
GOWN STRL REUS W/TWL XL LVL3 (GOWN DISPOSABLE) ×1
HOLDER KNEE FOAM BLUE (MISCELLANEOUS) ×2 IMPLANT
IV NS IRRIG 3000ML ARTHROMATIC (IV SOLUTION) ×4 IMPLANT
KNEE WRAP E Z 3 GEL PACK (MISCELLANEOUS) IMPLANT
MANIFOLD NEPTUNE II (INSTRUMENTS) ×2 IMPLANT
PACK ARTHROSCOPY DSU (CUSTOM PROCEDURE TRAY) ×2 IMPLANT
PACK BASIN DAY SURGERY FS (CUSTOM PROCEDURE TRAY) ×2 IMPLANT
PENCIL BUTTON HOLSTER BLD 10FT (ELECTRODE) IMPLANT
SET ARTHROSCOPY TUBING (MISCELLANEOUS) ×1
SET ARTHROSCOPY TUBING LN (MISCELLANEOUS) ×1 IMPLANT
SUT ETHILON 3 0 PS 1 (SUTURE) ×2 IMPLANT
SUT VIC AB 3-0 FS2 27 (SUTURE) IMPLANT
TOWEL OR 17X24 6PK STRL BLUE (TOWEL DISPOSABLE) ×2 IMPLANT
WATER STERILE IRR 1000ML POUR (IV SOLUTION) ×2 IMPLANT

## 2015-08-27 NOTE — Interval H&P Note (Signed)
History and Physical Interval Note:  08/27/2015 7:29 AM  Isaiah Todd  has presented today for surgery, with the diagnosis of chondromalacia patellae, right knee, other tear of medial meniscus, current injury, unspecified knee, initial encounter M22.41, S83.249A  The various methods of treatment have been discussed with the patient and family. After consideration of risks, benefits and other options for treatment, the patient has consented to  Procedure(s): RIGHT KNEE ARTHROSCOPY, CHONDROPLASTY WITH MEDIAL MENISCECTOMY (Right) as a surgical intervention .  The patient's history has been reviewed, patient examined, no change in status, stable for surgery.  I have reviewed the patient's chart and labs.  Questions were answered to the patient's satisfaction.     Isaiah Todd

## 2015-08-27 NOTE — Discharge Instructions (Signed)
Discharge Instructions after Knee Arthroscopy ° ° °You will have a light dressing on your knee.  °Leave the dressing in place until the third day after your surgery and then remove it and place a band-aid over the stitches.  °After the bandage has been removed you may shower, but do not soak the incision. °You may begin gentle motion of your leg immediately after surgery. °Pump your foot up and down 20 times per hour, every hour you are awake.  °Apply ice to the knee 3 times per day for 30 minutes for the first 1 week until your knee is feeling comfortable again. Do not use heat.  °You may begin straight leg raising exercises (if you have a brace with it on). While lying down, pull your foot all the way up, tighten your quadriceps muscle and lift your heel off of the ground. Hold this position for 2 seconds, and then let the leg back down. Repeat the exercise 10 times, at least 3 times a day.  °Pain medicine has been prescribed for you.  °Use your medicine as needed over the first 48 hours, and then you can begin to taper your use. You may take Extra Strength Tylenol or Tylenol only in place of the pain pills.  ° ° °Please call [phone] for any problems. Including the following: ° °- excessive redness of the incisions °- drainage for more than 4 days °- fever of more than 101.5 F ° °*Please note that pain medications will not be refilled after hours or on weekends. ° ° °Post Anesthesia Home Care Instructions ° °Activity: °Get plenty of rest for the remainder of the day. A responsible adult should stay with you for 24 hours following the procedure.  °For the next 24 hours, DO NOT: °-Drive a car °-Operate machinery °-Drink alcoholic beverages °-Take any medication unless instructed by your physician °-Make any legal decisions or sign important papers. ° °Meals: °Start with liquid foods such as gelatin or soup. Progress to regular foods as tolerated. Avoid greasy, spicy, heavy foods. If nausea and/or vomiting  occur, drink only clear liquids until the nausea and/or vomiting subsides. Call your physician if vomiting continues. ° °Special Instructions/Symptoms: °Your throat may feel dry or sore from the anesthesia or the breathing tube placed in your throat during surgery. If this causes discomfort, gargle with warm salt water. The discomfort should disappear within 24 hours. ° °If you had a scopolamine patch placed behind your ear for the management of post- operative nausea and/or vomiting: ° °1. The medication in the patch is effective for 72 hours, after which it should be removed.  Wrap patch in a tissue and discard in the trash. Wash hands thoroughly with soap and water. °2. You may remove the patch earlier than 72 hours if you experience unpleasant side effects which may include dry mouth, dizziness or visual disturbances. °3. Avoid touching the patch. Wash your hands with soap and water after contact with the patch. ° ° °Call your surgeon if you experience:  ° °1.  Fever over 101.0. °2.  Inability to urinate. °3.  Nausea and/or vomiting. °4.  Extreme swelling or bruising at the surgical site. °5.  Continued bleeding from the incision. °6.  Increased pain, redness or drainage from the incision. °7.  Problems related to your pain medication. °8. Any change in color, movement and/or sensation °9. Any problems and/or concerns ° ° °  ° °

## 2015-08-27 NOTE — Transfer of Care (Signed)
Immediate Anesthesia Transfer of Care Note  Patient: Isaiah Todd  Procedure(s) Performed: Procedure(s): RIGHT KNEE ARTHROSCOPY, CHONDROPLASTY WITH PARTIAL MEDIAL MENISCECTOMY (Right)  Patient Location: PACU  Anesthesia Type:General  Level of Consciousness: sedated, patient cooperative, lethargic and responds to stimulation  Airway & Oxygen Therapy: Patient Spontanous Breathing and Patient connected to face mask oxygen  Post-op Assessment: Report given to RN and Post -op Vital signs reviewed and stable  Post vital signs: Reviewed and stable  Last Vitals:  Filed Vitals:   08/27/15 0954  BP:   Pulse: 72  Temp:   Resp:     Complications: No apparent anesthesia complications

## 2015-08-27 NOTE — Anesthesia Procedure Notes (Signed)
Procedure Name: LMA Insertion Date/Time: 08/27/2015 8:57 AM Performed by: Lyndee Leo Pre-anesthesia Checklist: Patient identified, Emergency Drugs available, Suction available and Patient being monitored Patient Re-evaluated:Patient Re-evaluated prior to inductionOxygen Delivery Method: Circle System Utilized Preoxygenation: Pre-oxygenation with 100% oxygen Intubation Type: IV induction Ventilation: Mask ventilation without difficulty LMA: LMA inserted LMA Size: 4.0 Number of attempts: 1 Airway Equipment and Method: Bite block Placement Confirmation: positive ETCO2 Tube secured with: Tape Dental Injury: Teeth and Oropharynx as per pre-operative assessment

## 2015-08-28 ENCOUNTER — Encounter (HOSPITAL_BASED_OUTPATIENT_CLINIC_OR_DEPARTMENT_OTHER): Payer: Self-pay | Admitting: Orthopedic Surgery

## 2015-08-28 NOTE — Op Note (Signed)
NAMEGARRETH, Isaiah Todd NO.:  [id number]  MEDICAL RECORD NO.:  [id number]  LOCATION:                               FACILITY:  [facility]  PHYSICIAN:  Ninetta Lights, M.D. DATE OF BIRTH:  1951-04-14  DATE OF PROCEDURE:  08/27/2015 DATE OF DISCHARGE:  08/27/2015                              OPERATIVE REPORT   PREOPERATIVE DIAGNOSIS:  Right knee medial meniscus tear.  POSTOPERATIVE DIAGNOSIS:  Right knee medial meniscus tear with some grade 3 changes weightbearing dome medial femoral condyle and grade 2 patellofemoral joint.  PROCEDURE:  Right knee exam under anesthesia and arthroscopy.  Partial medial meniscectomy.  Chondroplasty medial compartment patella.  SURGEON:  Ninetta Lights, M.D.  ASSISTANT:  Elmyra Ricks, PA.  ANESTHESIA:  General.  BLOOD LOSS:  Minimal.  SPECIMENS:  None.  CULTURES:  None.  COMPLICATION:  None.  DRESSINGS:  Soft compressive.  TOURNIQUET:  Not employed.  DESCRIPTION OF PROCEDURE:  The patient was brought to the operating room, placed on the operating table in supine position.  After adequate anesthesia had been obtained, leg holder applied.  Leg prepped and draped in usual sterile fashion.  Two portals, one each medial and lateral parapatellar.  Arthroscope induced, the knee distended and inspected.  Good patellar tracking.  Some grade 2 changes medially debrided.  Trochlea looked good.  Hypertrophic synovitis throughout debrided.  ACL intact.  Lateral meniscus and lateral compartment normal. Medial meniscus extensive complex tearing.  A portion of this flipped underneath the gutter, that was teased out and then the entire posterior half taken to a stable rim, tapered into remaining meniscus.  Some grade 2 and 3 changes on the condyle debrided to a stable surface.  Nothing grade 4.  Entire knee examined.  No other findings were appreciated. Instruments were completely removed.  Portals were closed with nylon. Knee  injected with Depo-Medrol and Marcaine.  Sterile compressive dressing applied.  Anesthesia reversed.  Brought to the recovery room. Tolerated the surgery well.  No complications.     Ninetta Lights, M.D.     DFM/MEDQ  D:  08/27/2015  T:  08/27/2015  Job:  [id number]

## 2015-08-31 NOTE — Anesthesia Postprocedure Evaluation (Signed)
  Anesthesia Post-op Note  Patient: Isaiah Todd  Procedure(s) Performed: Procedure(s) (LRB): RIGHT KNEE ARTHROSCOPY, CHONDROPLASTY WITH PARTIAL MEDIAL MENISCECTOMY (Right)    Anesthesia Type: General   Post-op Assessment: Post-op Vital signs reviewed, Patient's Cardiovascular Status Stable, Respiratory Function Stable, Patent Airway and No signs of Nausea or vomiting  Last Vitals:  Filed Vitals:   08/27/15 1109  BP: 142/79  Pulse: 86  Temp: 36.5 C  Resp: 16    Post-op Vital Signs: stable   Complications: No apparent anesthesia complications

## 2016-01-01 MED FILL — CIALIS 20 MG TABLET: 20 | 90 days supply | Qty: 18 | Fill #1

## 2016-04-05 MED FILL — CIALIS 20 MG TABLET: 20 | 90 days supply | Qty: 18 | Fill #2

## 2016-07-12 MED FILL — CIALIS 20 MG TABLET: 20 | 90 days supply | Qty: 18 | Fill #3

## 2016-08-02 DIAGNOSIS — Z125 Encounter for screening for malignant neoplasm of prostate: Secondary | ICD-10-CM | POA: Diagnosis not present

## 2016-08-02 DIAGNOSIS — E669 Obesity, unspecified: Secondary | ICD-10-CM | POA: Diagnosis not present

## 2016-08-02 DIAGNOSIS — Z79899 Other long term (current) drug therapy: Secondary | ICD-10-CM | POA: Diagnosis not present

## 2016-08-02 DIAGNOSIS — Z23 Encounter for immunization: Secondary | ICD-10-CM | POA: Diagnosis not present

## 2016-08-02 DIAGNOSIS — Z0000 Encounter for general adult medical examination without abnormal findings: Secondary | ICD-10-CM | POA: Diagnosis not present

## 2016-08-02 DIAGNOSIS — Z6832 Body mass index (BMI) 32.0-32.9, adult: Secondary | ICD-10-CM | POA: Diagnosis not present

## 2016-08-02 DIAGNOSIS — N529 Male erectile dysfunction, unspecified: Secondary | ICD-10-CM | POA: Diagnosis not present

## 2016-08-12 MED FILL — VIAGRA 50 MG TABLET: 50 | 30 days supply | Qty: 6 | Fill #0

## 2016-09-07 MED FILL — VIAGRA 50 MG TABLET: 50 | 90 days supply | Qty: 18 | Fill #1

## 2016-09-08 DIAGNOSIS — H52222 Regular astigmatism, left eye: Secondary | ICD-10-CM | POA: Diagnosis not present

## 2016-09-08 DIAGNOSIS — H5213 Myopia, bilateral: Secondary | ICD-10-CM | POA: Diagnosis not present

## 2016-09-08 DIAGNOSIS — H524 Presbyopia: Secondary | ICD-10-CM | POA: Diagnosis not present

## 2016-09-08 DIAGNOSIS — H25813 Combined forms of age-related cataract, bilateral: Secondary | ICD-10-CM | POA: Diagnosis not present

## 2017-01-20 MED FILL — SILDENAFIL 50 MG TABLET: 50 | 90 days supply | Qty: 18 | Fill #2

## 2017-04-17 MED FILL — SILDENAFIL 50 MG TABLET: 50 | 90 days supply | Qty: 18 | Fill #3

## 2017-07-17 MED FILL — SILDENAFIL CITRATE 50 MG TA: 50 | 60 days supply | Qty: 12 | Fill #4

## 2017-08-04 DIAGNOSIS — E669 Obesity, unspecified: Secondary | ICD-10-CM | POA: Diagnosis not present

## 2017-08-04 DIAGNOSIS — Z23 Encounter for immunization: Secondary | ICD-10-CM | POA: Diagnosis not present

## 2017-08-04 DIAGNOSIS — Z0000 Encounter for general adult medical examination without abnormal findings: Secondary | ICD-10-CM | POA: Diagnosis not present

## 2017-08-04 DIAGNOSIS — N529 Male erectile dysfunction, unspecified: Secondary | ICD-10-CM | POA: Diagnosis not present

## 2017-08-04 DIAGNOSIS — Z79899 Other long term (current) drug therapy: Secondary | ICD-10-CM | POA: Diagnosis not present

## 2017-08-04 DIAGNOSIS — Z6833 Body mass index (BMI) 33.0-33.9, adult: Secondary | ICD-10-CM | POA: Diagnosis not present

## 2017-08-15 DIAGNOSIS — H2513 Age-related nuclear cataract, bilateral: Secondary | ICD-10-CM | POA: Diagnosis not present

## 2017-08-15 DIAGNOSIS — H52222 Regular astigmatism, left eye: Secondary | ICD-10-CM | POA: Diagnosis not present

## 2017-08-15 DIAGNOSIS — H5213 Myopia, bilateral: Secondary | ICD-10-CM | POA: Diagnosis not present

## 2017-08-15 DIAGNOSIS — H524 Presbyopia: Secondary | ICD-10-CM | POA: Diagnosis not present

## 2017-09-20 MED FILL — SILDENAFIL CITRATE 50 MG TA: 50 | 90 days supply | Qty: 18 | Fill #0 | Status: TO

## 2018-03-20 DIAGNOSIS — R69 Illness, unspecified: Secondary | ICD-10-CM | POA: Diagnosis not present

## 2018-04-12 DIAGNOSIS — Z1211 Encounter for screening for malignant neoplasm of colon: Secondary | ICD-10-CM | POA: Diagnosis not present

## 2018-04-12 DIAGNOSIS — D126 Benign neoplasm of colon, unspecified: Secondary | ICD-10-CM | POA: Diagnosis not present

## 2018-04-17 DIAGNOSIS — D126 Benign neoplasm of colon, unspecified: Secondary | ICD-10-CM | POA: Diagnosis not present

## 2018-05-03 DIAGNOSIS — L820 Inflamed seborrheic keratosis: Secondary | ICD-10-CM | POA: Diagnosis not present

## 2018-05-03 DIAGNOSIS — L821 Other seborrheic keratosis: Secondary | ICD-10-CM | POA: Diagnosis not present

## 2018-05-03 DIAGNOSIS — D1801 Hemangioma of skin and subcutaneous tissue: Secondary | ICD-10-CM | POA: Diagnosis not present

## 2018-05-22 DIAGNOSIS — Z8249 Family history of ischemic heart disease and other diseases of the circulatory system: Secondary | ICD-10-CM | POA: Diagnosis not present

## 2018-05-22 DIAGNOSIS — E669 Obesity, unspecified: Secondary | ICD-10-CM | POA: Diagnosis not present

## 2018-05-22 DIAGNOSIS — Z803 Family history of malignant neoplasm of breast: Secondary | ICD-10-CM | POA: Diagnosis not present

## 2018-05-22 DIAGNOSIS — Z791 Long term (current) use of non-steroidal anti-inflammatories (NSAID): Secondary | ICD-10-CM | POA: Diagnosis not present

## 2018-05-22 DIAGNOSIS — Z6835 Body mass index (BMI) 35.0-35.9, adult: Secondary | ICD-10-CM | POA: Diagnosis not present

## 2018-07-19 DIAGNOSIS — R69 Illness, unspecified: Secondary | ICD-10-CM | POA: Diagnosis not present

## 2018-07-24 DIAGNOSIS — R69 Illness, unspecified: Secondary | ICD-10-CM | POA: Diagnosis not present

## 2018-08-13 DIAGNOSIS — N529 Male erectile dysfunction, unspecified: Secondary | ICD-10-CM | POA: Diagnosis not present

## 2018-08-13 DIAGNOSIS — Z23 Encounter for immunization: Secondary | ICD-10-CM | POA: Diagnosis not present

## 2018-08-13 DIAGNOSIS — E669 Obesity, unspecified: Secondary | ICD-10-CM | POA: Diagnosis not present

## 2018-08-13 DIAGNOSIS — Z136 Encounter for screening for cardiovascular disorders: Secondary | ICD-10-CM | POA: Diagnosis not present

## 2018-08-13 DIAGNOSIS — Z0000 Encounter for general adult medical examination without abnormal findings: Secondary | ICD-10-CM | POA: Diagnosis not present

## 2018-08-13 DIAGNOSIS — K4090 Unilateral inguinal hernia, without obstruction or gangrene, not specified as recurrent: Secondary | ICD-10-CM | POA: Diagnosis not present

## 2018-08-13 DIAGNOSIS — Z125 Encounter for screening for malignant neoplasm of prostate: Secondary | ICD-10-CM | POA: Diagnosis not present

## 2018-08-13 DIAGNOSIS — Z79899 Other long term (current) drug therapy: Secondary | ICD-10-CM | POA: Diagnosis not present

## 2018-10-19 DIAGNOSIS — H524 Presbyopia: Secondary | ICD-10-CM | POA: Diagnosis not present

## 2018-10-19 DIAGNOSIS — H5213 Myopia, bilateral: Secondary | ICD-10-CM | POA: Diagnosis not present

## 2018-10-19 DIAGNOSIS — H40053 Ocular hypertension, bilateral: Secondary | ICD-10-CM | POA: Diagnosis not present

## 2018-10-19 DIAGNOSIS — H40013 Open angle with borderline findings, low risk, bilateral: Secondary | ICD-10-CM | POA: Diagnosis not present

## 2018-10-19 DIAGNOSIS — H52223 Regular astigmatism, bilateral: Secondary | ICD-10-CM | POA: Diagnosis not present

## 2018-10-19 DIAGNOSIS — H43393 Other vitreous opacities, bilateral: Secondary | ICD-10-CM | POA: Diagnosis not present

## 2018-11-05 DIAGNOSIS — M25572 Pain in left ankle and joints of left foot: Secondary | ICD-10-CM | POA: Diagnosis not present

## 2018-11-12 DIAGNOSIS — R269 Unspecified abnormalities of gait and mobility: Secondary | ICD-10-CM | POA: Diagnosis not present

## 2018-11-12 DIAGNOSIS — M25672 Stiffness of left ankle, not elsewhere classified: Secondary | ICD-10-CM | POA: Diagnosis not present

## 2018-11-12 DIAGNOSIS — S93432D Sprain of tibiofibular ligament of left ankle, subsequent encounter: Secondary | ICD-10-CM | POA: Diagnosis not present

## 2018-11-12 DIAGNOSIS — M6281 Muscle weakness (generalized): Secondary | ICD-10-CM | POA: Diagnosis not present

## 2018-11-12 DIAGNOSIS — R69 Illness, unspecified: Secondary | ICD-10-CM | POA: Diagnosis not present

## 2018-11-14 DIAGNOSIS — S93432D Sprain of tibiofibular ligament of left ankle, subsequent encounter: Secondary | ICD-10-CM | POA: Diagnosis not present

## 2018-11-14 DIAGNOSIS — R269 Unspecified abnormalities of gait and mobility: Secondary | ICD-10-CM | POA: Diagnosis not present

## 2018-11-14 DIAGNOSIS — M25672 Stiffness of left ankle, not elsewhere classified: Secondary | ICD-10-CM | POA: Diagnosis not present

## 2018-11-14 DIAGNOSIS — M6281 Muscle weakness (generalized): Secondary | ICD-10-CM | POA: Diagnosis not present

## 2018-11-19 DIAGNOSIS — R269 Unspecified abnormalities of gait and mobility: Secondary | ICD-10-CM | POA: Diagnosis not present

## 2018-11-19 DIAGNOSIS — S93432D Sprain of tibiofibular ligament of left ankle, subsequent encounter: Secondary | ICD-10-CM | POA: Diagnosis not present

## 2018-11-19 DIAGNOSIS — M6281 Muscle weakness (generalized): Secondary | ICD-10-CM | POA: Diagnosis not present

## 2018-11-19 DIAGNOSIS — M25672 Stiffness of left ankle, not elsewhere classified: Secondary | ICD-10-CM | POA: Diagnosis not present

## 2018-11-20 DIAGNOSIS — R69 Illness, unspecified: Secondary | ICD-10-CM | POA: Diagnosis not present

## 2019-01-03 DIAGNOSIS — L239 Allergic contact dermatitis, unspecified cause: Secondary | ICD-10-CM | POA: Diagnosis not present

## 2019-02-25 ENCOUNTER — Other Ambulatory Visit: Payer: Self-pay | Admitting: Geriatric Medicine

## 2019-02-25 DIAGNOSIS — R21 Rash and other nonspecific skin eruption: Secondary | ICD-10-CM | POA: Diagnosis not present

## 2019-02-25 DIAGNOSIS — R609 Edema, unspecified: Secondary | ICD-10-CM

## 2019-02-25 DIAGNOSIS — R600 Localized edema: Secondary | ICD-10-CM | POA: Diagnosis not present

## 2019-02-26 DIAGNOSIS — I82452 Acute embolism and thrombosis of left peroneal vein: Secondary | ICD-10-CM | POA: Diagnosis not present

## 2019-02-26 DIAGNOSIS — I82442 Acute embolism and thrombosis of left tibial vein: Secondary | ICD-10-CM | POA: Diagnosis not present

## 2019-02-26 DIAGNOSIS — I82412 Acute embolism and thrombosis of left femoral vein: Secondary | ICD-10-CM | POA: Diagnosis not present

## 2019-02-27 DIAGNOSIS — I8312 Varicose veins of left lower extremity with inflammation: Secondary | ICD-10-CM | POA: Diagnosis not present

## 2019-02-27 DIAGNOSIS — L309 Dermatitis, unspecified: Secondary | ICD-10-CM | POA: Diagnosis not present

## 2019-02-27 DIAGNOSIS — I872 Venous insufficiency (chronic) (peripheral): Secondary | ICD-10-CM | POA: Diagnosis not present

## 2019-02-27 DIAGNOSIS — I8311 Varicose veins of right lower extremity with inflammation: Secondary | ICD-10-CM | POA: Diagnosis not present

## 2019-02-28 ENCOUNTER — Other Ambulatory Visit: Payer: 59

## 2019-03-06 DIAGNOSIS — I82412 Acute embolism and thrombosis of left femoral vein: Secondary | ICD-10-CM | POA: Diagnosis not present

## 2019-03-06 DIAGNOSIS — I82432 Acute embolism and thrombosis of left popliteal vein: Secondary | ICD-10-CM | POA: Diagnosis not present

## 2019-03-21 DIAGNOSIS — R69 Illness, unspecified: Secondary | ICD-10-CM | POA: Diagnosis not present

## 2019-03-29 DIAGNOSIS — Z23 Encounter for immunization: Secondary | ICD-10-CM | POA: Diagnosis not present

## 2019-03-29 DIAGNOSIS — I82412 Acute embolism and thrombosis of left femoral vein: Secondary | ICD-10-CM | POA: Diagnosis not present

## 2019-07-17 DIAGNOSIS — R69 Illness, unspecified: Secondary | ICD-10-CM | POA: Diagnosis not present

## 2019-08-06 DIAGNOSIS — R69 Illness, unspecified: Secondary | ICD-10-CM | POA: Diagnosis not present

## 2019-08-16 DIAGNOSIS — Z23 Encounter for immunization: Secondary | ICD-10-CM | POA: Diagnosis not present

## 2019-08-16 DIAGNOSIS — Z0000 Encounter for general adult medical examination without abnormal findings: Secondary | ICD-10-CM | POA: Diagnosis not present

## 2019-08-16 DIAGNOSIS — Z86718 Personal history of other venous thrombosis and embolism: Secondary | ICD-10-CM | POA: Diagnosis not present

## 2019-08-16 DIAGNOSIS — Z13220 Encounter for screening for lipoid disorders: Secondary | ICD-10-CM | POA: Diagnosis not present

## 2019-08-16 DIAGNOSIS — N400 Enlarged prostate without lower urinary tract symptoms: Secondary | ICD-10-CM | POA: Diagnosis not present

## 2019-08-16 DIAGNOSIS — Z1331 Encounter for screening for depression: Secondary | ICD-10-CM | POA: Diagnosis not present

## 2019-08-16 DIAGNOSIS — Z79899 Other long term (current) drug therapy: Secondary | ICD-10-CM | POA: Diagnosis not present

## 2019-08-22 DIAGNOSIS — N402 Nodular prostate without lower urinary tract symptoms: Secondary | ICD-10-CM | POA: Diagnosis not present

## 2019-08-22 DIAGNOSIS — R3121 Asymptomatic microscopic hematuria: Secondary | ICD-10-CM | POA: Diagnosis not present

## 2019-08-22 DIAGNOSIS — N5201 Erectile dysfunction due to arterial insufficiency: Secondary | ICD-10-CM | POA: Diagnosis not present

## 2019-08-22 DIAGNOSIS — R972 Elevated prostate specific antigen [PSA]: Secondary | ICD-10-CM | POA: Diagnosis not present

## 2019-09-30 DIAGNOSIS — C61 Malignant neoplasm of prostate: Secondary | ICD-10-CM | POA: Diagnosis not present

## 2019-09-30 DIAGNOSIS — R972 Elevated prostate specific antigen [PSA]: Secondary | ICD-10-CM | POA: Diagnosis not present

## 2019-10-15 ENCOUNTER — Other Ambulatory Visit: Payer: Self-pay

## 2019-10-15 DIAGNOSIS — Z20822 Contact with and (suspected) exposure to covid-19: Secondary | ICD-10-CM

## 2019-10-17 LAB — NOVEL CORONAVIRUS, NAA: SARS-CoV-2, NAA: NOT DETECTED

## 2019-11-11 DIAGNOSIS — C61 Malignant neoplasm of prostate: Secondary | ICD-10-CM | POA: Diagnosis not present

## 2019-11-19 DIAGNOSIS — R69 Illness, unspecified: Secondary | ICD-10-CM | POA: Diagnosis not present

## 2019-11-21 DIAGNOSIS — Z79899 Other long term (current) drug therapy: Secondary | ICD-10-CM | POA: Diagnosis not present

## 2019-11-21 DIAGNOSIS — C61 Malignant neoplasm of prostate: Secondary | ICD-10-CM | POA: Diagnosis not present

## 2019-11-29 ENCOUNTER — Ambulatory Visit: Payer: Medicare HMO | Attending: Internal Medicine

## 2019-11-29 DIAGNOSIS — Z23 Encounter for immunization: Secondary | ICD-10-CM | POA: Insufficient documentation

## 2019-11-29 NOTE — Progress Notes (Signed)
   Covid-19 Vaccination Clinic  Name:  Isaiah Todd    MRN: [id number] DOB: [birth year]  11/29/2019  [name] was observed post Covid-19 immunization for 15 minutes without incidence. He was provided with Vaccine Information Sheet and instruction to access the V-Safe system.   [name] was instructed to call 911 with any severe reactions post vaccine: Marland Kitchen Difficulty breathing  . Swelling of your face and throat  . A fast heartbeat  . A bad rash all over your body  . Dizziness and weakness    Immunizations Administered    Name Date Dose VIS Date Route   Pfizer COVID-19 Vaccine 11/29/2019  2:29 PM 0.3 mL 10/18/2019 Intramuscular   Manufacturer: [place]   Lot: [id number]   [place]: [id number]

## 2019-12-09 DIAGNOSIS — C61 Malignant neoplasm of prostate: Secondary | ICD-10-CM | POA: Diagnosis not present

## 2019-12-09 DIAGNOSIS — Z01818 Encounter for other preprocedural examination: Secondary | ICD-10-CM | POA: Diagnosis not present

## 2019-12-11 DIAGNOSIS — Z7901 Long term (current) use of anticoagulants: Secondary | ICD-10-CM | POA: Diagnosis not present

## 2019-12-11 DIAGNOSIS — Z86718 Personal history of other venous thrombosis and embolism: Secondary | ICD-10-CM | POA: Diagnosis not present

## 2019-12-11 DIAGNOSIS — Z20822 Contact with and (suspected) exposure to covid-19: Secondary | ICD-10-CM | POA: Diagnosis not present

## 2019-12-11 DIAGNOSIS — C61 Malignant neoplasm of prostate: Secondary | ICD-10-CM | POA: Diagnosis not present

## 2019-12-19 DIAGNOSIS — C61 Malignant neoplasm of prostate: Secondary | ICD-10-CM | POA: Diagnosis not present

## 2019-12-20 ENCOUNTER — Ambulatory Visit: Payer: Medicare HMO | Attending: Internal Medicine

## 2019-12-20 DIAGNOSIS — Z23 Encounter for immunization: Secondary | ICD-10-CM | POA: Insufficient documentation

## 2019-12-20 NOTE — Progress Notes (Signed)
   Covid-19 Vaccination Clinic  Name:  Isaiah Todd    MRN: [id number] DOB: [birth year]  12/20/2019  [name] was observed post Covid-19 immunization for 15 minutes without incidence. He was provided with Vaccine Information Sheet and instruction to access the V-Safe system.   [name] was instructed to call 911 with any severe reactions post vaccine: Marland Kitchen Difficulty breathing  . Swelling of your face and throat  . A fast heartbeat  . A bad rash all over your body  . Dizziness and weakness    Immunizations Administered    Name Date Dose VIS Date Route   Pfizer COVID-19 Vaccine 12/20/2019  2:24 PM 0.3 mL 10/18/2019 Intramuscular   Manufacturer: [place]   Lot: [id number]   [place]: [id number]

## 2020-01-22 DIAGNOSIS — K4090 Unilateral inguinal hernia, without obstruction or gangrene, not specified as recurrent: Secondary | ICD-10-CM | POA: Diagnosis not present

## 2020-01-22 DIAGNOSIS — R102 Pelvic and perineal pain: Secondary | ICD-10-CM | POA: Diagnosis not present

## 2020-01-22 DIAGNOSIS — I898 Other specified noninfective disorders of lymphatic vessels and lymph nodes: Secondary | ICD-10-CM | POA: Diagnosis not present

## 2020-01-29 DIAGNOSIS — Z7901 Long term (current) use of anticoagulants: Secondary | ICD-10-CM | POA: Diagnosis not present

## 2020-01-29 DIAGNOSIS — I898 Other specified noninfective disorders of lymphatic vessels and lymph nodes: Secondary | ICD-10-CM | POA: Diagnosis not present

## 2020-01-29 DIAGNOSIS — R188 Other ascites: Secondary | ICD-10-CM | POA: Diagnosis not present

## 2020-01-29 DIAGNOSIS — C61 Malignant neoplasm of prostate: Secondary | ICD-10-CM | POA: Diagnosis not present

## 2020-02-10 DIAGNOSIS — R278 Other lack of coordination: Secondary | ICD-10-CM | POA: Diagnosis not present

## 2020-02-10 DIAGNOSIS — N393 Stress incontinence (female) (male): Secondary | ICD-10-CM | POA: Diagnosis not present

## 2020-02-10 DIAGNOSIS — C61 Malignant neoplasm of prostate: Secondary | ICD-10-CM | POA: Diagnosis not present

## 2020-02-10 DIAGNOSIS — M6281 Muscle weakness (generalized): Secondary | ICD-10-CM | POA: Diagnosis not present

## 2020-02-17 DIAGNOSIS — Z8546 Personal history of malignant neoplasm of prostate: Secondary | ICD-10-CM | POA: Diagnosis not present

## 2020-02-17 DIAGNOSIS — I872 Venous insufficiency (chronic) (peripheral): Secondary | ICD-10-CM | POA: Diagnosis not present

## 2020-02-18 DIAGNOSIS — K4090 Unilateral inguinal hernia, without obstruction or gangrene, not specified as recurrent: Secondary | ICD-10-CM | POA: Diagnosis not present

## 2020-02-18 DIAGNOSIS — C61 Malignant neoplasm of prostate: Secondary | ICD-10-CM | POA: Diagnosis not present

## 2020-02-18 DIAGNOSIS — I898 Other specified noninfective disorders of lymphatic vessels and lymph nodes: Secondary | ICD-10-CM | POA: Diagnosis not present

## 2020-03-06 DIAGNOSIS — R278 Other lack of coordination: Secondary | ICD-10-CM | POA: Diagnosis not present

## 2020-03-06 DIAGNOSIS — M6281 Muscle weakness (generalized): Secondary | ICD-10-CM | POA: Diagnosis not present

## 2020-03-06 DIAGNOSIS — N393 Stress incontinence (female) (male): Secondary | ICD-10-CM | POA: Diagnosis not present

## 2020-03-16 DIAGNOSIS — K4090 Unilateral inguinal hernia, without obstruction or gangrene, not specified as recurrent: Secondary | ICD-10-CM | POA: Diagnosis not present

## 2020-03-16 DIAGNOSIS — R0683 Snoring: Secondary | ICD-10-CM | POA: Diagnosis not present

## 2020-03-16 DIAGNOSIS — Z01818 Encounter for other preprocedural examination: Secondary | ICD-10-CM | POA: Diagnosis not present

## 2020-03-16 DIAGNOSIS — I82519 Chronic embolism and thrombosis of unspecified femoral vein: Secondary | ICD-10-CM | POA: Diagnosis not present

## 2020-03-16 DIAGNOSIS — C61 Malignant neoplasm of prostate: Secondary | ICD-10-CM | POA: Diagnosis not present

## 2020-03-24 DIAGNOSIS — R69 Illness, unspecified: Secondary | ICD-10-CM | POA: Diagnosis not present

## 2020-03-27 DIAGNOSIS — H524 Presbyopia: Secondary | ICD-10-CM | POA: Diagnosis not present

## 2020-03-27 DIAGNOSIS — R278 Other lack of coordination: Secondary | ICD-10-CM | POA: Diagnosis not present

## 2020-03-27 DIAGNOSIS — M6281 Muscle weakness (generalized): Secondary | ICD-10-CM | POA: Diagnosis not present

## 2020-03-27 DIAGNOSIS — H5213 Myopia, bilateral: Secondary | ICD-10-CM | POA: Diagnosis not present

## 2020-03-27 DIAGNOSIS — N393 Stress incontinence (female) (male): Secondary | ICD-10-CM | POA: Diagnosis not present

## 2020-03-27 DIAGNOSIS — H52223 Regular astigmatism, bilateral: Secondary | ICD-10-CM | POA: Diagnosis not present

## 2020-04-08 DIAGNOSIS — Z8546 Personal history of malignant neoplasm of prostate: Secondary | ICD-10-CM | POA: Diagnosis not present

## 2020-04-08 DIAGNOSIS — R600 Localized edema: Secondary | ICD-10-CM | POA: Diagnosis not present

## 2020-04-08 DIAGNOSIS — Z7901 Long term (current) use of anticoagulants: Secondary | ICD-10-CM | POA: Diagnosis not present

## 2020-04-08 DIAGNOSIS — C61 Malignant neoplasm of prostate: Secondary | ICD-10-CM | POA: Diagnosis not present

## 2020-04-08 DIAGNOSIS — Z20822 Contact with and (suspected) exposure to covid-19: Secondary | ICD-10-CM | POA: Diagnosis not present

## 2020-04-08 DIAGNOSIS — I898 Other specified noninfective disorders of lymphatic vessels and lymph nodes: Secondary | ICD-10-CM | POA: Diagnosis not present

## 2020-04-08 DIAGNOSIS — K4090 Unilateral inguinal hernia, without obstruction or gangrene, not specified as recurrent: Secondary | ICD-10-CM | POA: Diagnosis not present

## 2020-04-08 DIAGNOSIS — Z9079 Acquired absence of other genital organ(s): Secondary | ICD-10-CM | POA: Diagnosis not present

## 2020-04-08 DIAGNOSIS — Z86718 Personal history of other venous thrombosis and embolism: Secondary | ICD-10-CM | POA: Diagnosis not present

## 2020-04-15 DIAGNOSIS — L309 Dermatitis, unspecified: Secondary | ICD-10-CM | POA: Diagnosis not present

## 2020-04-15 DIAGNOSIS — L570 Actinic keratosis: Secondary | ICD-10-CM | POA: Diagnosis not present

## 2020-04-15 DIAGNOSIS — L821 Other seborrheic keratosis: Secondary | ICD-10-CM | POA: Diagnosis not present

## 2020-04-28 DIAGNOSIS — K4090 Unilateral inguinal hernia, without obstruction or gangrene, not specified as recurrent: Secondary | ICD-10-CM | POA: Diagnosis not present

## 2020-05-07 DIAGNOSIS — N5231 Erectile dysfunction following radical prostatectomy: Secondary | ICD-10-CM | POA: Diagnosis not present

## 2020-05-07 DIAGNOSIS — M6281 Muscle weakness (generalized): Secondary | ICD-10-CM | POA: Diagnosis not present

## 2020-05-14 DIAGNOSIS — R69 Illness, unspecified: Secondary | ICD-10-CM | POA: Diagnosis not present

## 2020-06-16 DIAGNOSIS — M6281 Muscle weakness (generalized): Secondary | ICD-10-CM | POA: Diagnosis not present

## 2020-07-21 DIAGNOSIS — M6281 Muscle weakness (generalized): Secondary | ICD-10-CM | POA: Diagnosis not present

## 2020-07-21 DIAGNOSIS — R69 Illness, unspecified: Secondary | ICD-10-CM | POA: Diagnosis not present

## 2020-07-23 DIAGNOSIS — R69 Illness, unspecified: Secondary | ICD-10-CM | POA: Diagnosis not present

## 2020-08-05 DIAGNOSIS — Z20828 Contact with and (suspected) exposure to other viral communicable diseases: Secondary | ICD-10-CM | POA: Diagnosis not present

## 2020-08-14 ENCOUNTER — Encounter (HOSPITAL_COMMUNITY): Payer: Self-pay | Admitting: *Deleted

## 2020-08-14 ENCOUNTER — Inpatient Hospital Stay (HOSPITAL_COMMUNITY)
Admission: EM | Admit: 2020-08-14 | Discharge: 2020-08-16 | DRG: 871 | Disposition: A | Discharge status: Home / Self Care | Payer: Medicare HMO | Attending: Internal Medicine | Admitting: Internal Medicine

## 2020-08-14 ENCOUNTER — Emergency Department (HOSPITAL_COMMUNITY): Payer: Medicare HMO

## 2020-08-14 ENCOUNTER — Inpatient Hospital Stay (HOSPITAL_COMMUNITY): Payer: Medicare HMO

## 2020-08-14 ENCOUNTER — Other Ambulatory Visit: Payer: Self-pay

## 2020-08-14 DIAGNOSIS — Z86718 Personal history of other venous thrombosis and embolism: Secondary | ICD-10-CM

## 2020-08-14 DIAGNOSIS — R197 Diarrhea, unspecified: Secondary | ICD-10-CM | POA: Diagnosis not present

## 2020-08-14 DIAGNOSIS — Z20822 Contact with and (suspected) exposure to covid-19: Secondary | ICD-10-CM | POA: Diagnosis not present

## 2020-08-14 DIAGNOSIS — R6521 Severe sepsis with septic shock: Secondary | ICD-10-CM | POA: Diagnosis not present

## 2020-08-14 DIAGNOSIS — Z8546 Personal history of malignant neoplasm of prostate: Secondary | ICD-10-CM

## 2020-08-14 DIAGNOSIS — R0602 Shortness of breath: Secondary | ICD-10-CM | POA: Diagnosis not present

## 2020-08-14 DIAGNOSIS — R059 Cough, unspecified: Secondary | ICD-10-CM | POA: Diagnosis not present

## 2020-08-14 DIAGNOSIS — A419 Sepsis, unspecified organism: Secondary | ICD-10-CM | POA: Diagnosis not present

## 2020-08-14 DIAGNOSIS — J189 Pneumonia, unspecified organism: Secondary | ICD-10-CM | POA: Diagnosis not present

## 2020-08-14 DIAGNOSIS — N179 Acute kidney failure, unspecified: Secondary | ICD-10-CM | POA: Diagnosis not present

## 2020-08-14 DIAGNOSIS — Z7722 Contact with and (suspected) exposure to environmental tobacco smoke (acute) (chronic): Secondary | ICD-10-CM | POA: Diagnosis present

## 2020-08-14 DIAGNOSIS — E872 Acidosis: Secondary | ICD-10-CM | POA: Diagnosis not present

## 2020-08-14 DIAGNOSIS — E860 Dehydration: Secondary | ICD-10-CM | POA: Diagnosis present

## 2020-08-14 DIAGNOSIS — R6520 Severe sepsis without septic shock: Secondary | ICD-10-CM

## 2020-08-14 DIAGNOSIS — C61 Malignant neoplasm of prostate: Secondary | ICD-10-CM

## 2020-08-14 HISTORY — DX: Reserved for inherently not codable concepts without codable children: IMO0001

## 2020-08-14 HISTORY — DX: Encounter for prophylactic measures, unspecified: Z29.9

## 2020-08-14 HISTORY — DX: Malignant (primary) neoplasm, unspecified: C80.1

## 2020-08-14 HISTORY — DX: Pneumonia, unspecified organism: J18.9

## 2020-08-14 LAB — CBC
HCT: 42.9 % (ref 39.0–52.0)
HCT: 38.4 % — ABNORMAL LOW (ref 39.0–52.0)
Hemoglobin: 14.1 g/dL (ref 13.0–17.0)
Hemoglobin: 12 g/dL — ABNORMAL LOW (ref 13.0–17.0)
MCH: 31 pg (ref 26.0–34.0)
MCH: 30 pg (ref 26.0–34.0)
MCHC: 32.9 g/dL (ref 30.0–36.0)
MCHC: 31.3 g/dL (ref 30.0–36.0)
MCV: 94.3 fL (ref 80.0–100.0)
MCV: 96 fL (ref 80.0–100.0)
Platelets: 151 10*3/uL (ref 150–400)
Platelets: DECREASED 10*3/uL (ref 150–400)
RBC: 4.55 MIL/uL (ref 4.22–5.81)
RBC: 4 MIL/uL — ABNORMAL LOW (ref 4.22–5.81)
RDW: 13.2 % (ref 11.5–15.5)
RDW: 13.2 % (ref 11.5–15.5)
WBC: 7.7 10*3/uL (ref 4.0–10.5)
WBC: 7.7 10*3/uL (ref 4.0–10.5)
nRBC: 0 % (ref 0.0–0.2)
nRBC: 0 % (ref 0.0–0.2)

## 2020-08-14 LAB — CREATININE, SERUM
Creatinine, Ser: 2 mg/dL — ABNORMAL HIGH (ref 0.61–1.24)
GFR, Estimated: 34 mL/min — ABNORMAL LOW (ref >60)

## 2020-08-14 LAB — LACTIC ACID, PLASMA
Lactic Acid, Venous: 4.9 mmol/L (ref 0.5–1.9)
Lactic Acid, Venous: 4.3 mmol/L (ref 0.5–1.9)
Lactic Acid, Venous: 5.8 mmol/L (ref 0.5–1.9)

## 2020-08-14 LAB — COMPREHENSIVE METABOLIC PANEL
ALT: 19 U/L (ref 0–44)
AST: 23 U/L (ref 15–41)
Albumin: 3.3 g/dL — ABNORMAL LOW (ref 3.5–5.0)
Alkaline Phosphatase: 57 U/L (ref 38–126)
Anion gap: 16 — ABNORMAL HIGH (ref 5–15)
BUN: 23 mg/dL (ref 8–23)
CO2: 18 mmol/L — ABNORMAL LOW (ref 22–32)
Calcium: 8.9 mg/dL (ref 8.9–10.3)
Chloride: 100 mmol/L (ref 98–111)
Creatinine, Ser: 1.95 mg/dL — ABNORMAL HIGH (ref 0.61–1.24)
GFR calc non Af Amer: 35 mL/min — ABNORMAL LOW (ref >60)
Glucose, Bld: 129 mg/dL — ABNORMAL HIGH (ref 70–99)
Potassium: 4.1 mmol/L (ref 3.5–5.1)
Sodium: 134 mmol/L — ABNORMAL LOW (ref 135–145)
Total Bilirubin: 2 mg/dL — ABNORMAL HIGH (ref 0.3–1.2)
Total Protein: 6.6 g/dL (ref 6.5–8.1)

## 2020-08-14 LAB — RESPIRATORY PANEL BY RT PCR (FLU A&B, COVID)
Influenza A by PCR: NEGATIVE
Influenza B by PCR: NEGATIVE
SARS Coronavirus 2 by RT PCR: NEGATIVE

## 2020-08-14 LAB — MRSA PCR SCREENING: MRSA by PCR: POSITIVE — AB

## 2020-08-14 LAB — D-DIMER, QUANTITATIVE: D-Dimer, Quant: 1.41 ug/mL-FEU — ABNORMAL HIGH (ref 0.00–0.50)

## 2020-08-14 LAB — LIPASE, BLOOD: Lipase: 26 U/L (ref 11–51)

## 2020-08-14 LAB — HIV ANTIBODY (ROUTINE TESTING W REFLEX): HIV Screen 4th Generation wRfx: NONREACTIVE

## 2020-08-14 MED ORDER — VANCOMYCIN HCL 1500 MG/300ML IV SOLN
1500.0000 mg | Freq: Once | INTRAVENOUS | Status: DC
  Filled 2020-08-14: qty 300

## 2020-08-14 MED ORDER — POLYETHYLENE GLYCOL 3350 17 G PO PACK: 17.0000 g | PACK | Freq: Every day | ORAL | Status: DC | PRN

## 2020-08-14 MED ORDER — LOPERAMIDE HCL 2 MG PO CAPS
2.0000 mg | ORAL_CAPSULE | ORAL | Status: DC | PRN
  Administered 2020-08-14 – 2020-08-15 (×3): 2 mg via ORAL
  Filled 2020-08-14 (×3): qty 1

## 2020-08-14 MED ORDER — VANCOMYCIN VARIABLE DOSE PER UNSTABLE RENAL FUNCTION (PHARMACIST DOSING): Status: DC

## 2020-08-14 MED ORDER — IPRATROPIUM-ALBUTEROL 0.5-2.5 (3) MG/3ML IN SOLN
3.0000 mL | Freq: Four times a day (QID) | RESPIRATORY_TRACT | Status: AC
  Administered 2020-08-14 – 2020-08-15 (×3): 3 mL via RESPIRATORY_TRACT
  Filled 2020-08-14 (×4): qty 3

## 2020-08-14 MED ORDER — DM-GUAIFENESIN ER 30-600 MG PO TB12
1.0000 | ORAL_TABLET | Freq: Two times a day (BID) | ORAL | Status: DC
  Administered 2020-08-14 – 2020-08-16 (×4): 1 via ORAL
  Filled 2020-08-14 (×4): qty 1

## 2020-08-14 MED ORDER — TRAZODONE HCL 50 MG PO TABS: 50.0000 mg | ORAL_TABLET | Freq: Every evening | ORAL | Status: DC | PRN

## 2020-08-14 MED ORDER — SODIUM CHLORIDE 0.9 % IV SOLN
1.0000 g | Freq: Once | INTRAVENOUS | Status: AC
  Administered 2020-08-14: 1 g via INTRAVENOUS
  Filled 2020-08-14: qty 10

## 2020-08-14 MED ORDER — SODIUM CHLORIDE 0.9 % IV BOLUS
1000.0000 mL | Freq: Once | INTRAVENOUS | Status: AC
  Administered 2020-08-14: 1000 mL via INTRAVENOUS

## 2020-08-14 MED ORDER — ACETAMINOPHEN 325 MG PO TABS
650.0000 mg | ORAL_TABLET | Freq: Four times a day (QID) | ORAL | Status: DC | PRN
  Administered 2020-08-14: 650 mg via ORAL
  Filled 2020-08-14: qty 2

## 2020-08-14 MED ORDER — IPRATROPIUM-ALBUTEROL 0.5-2.5 (3) MG/3ML IN SOLN
3.0000 mL | RESPIRATORY_TRACT | Status: AC
  Administered 2020-08-14: 3 mL via RESPIRATORY_TRACT
  Filled 2020-08-14: qty 3

## 2020-08-14 MED ORDER — SODIUM CHLORIDE 0.9 % IV SOLN
1.0000 g | INTRAVENOUS | Status: DC
  Administered 2020-08-15 – 2020-08-16 (×2): 1 g via INTRAVENOUS
  Filled 2020-08-14 (×2): qty 10

## 2020-08-14 MED ORDER — ACETAMINOPHEN 650 MG RE SUPP: 650.0000 mg | Freq: Four times a day (QID) | RECTAL | Status: DC | PRN

## 2020-08-14 MED ORDER — SODIUM CHLORIDE 0.9 % IV SOLN
500.0000 mg | Freq: Once | INTRAVENOUS | Status: AC
  Administered 2020-08-14: 500 mg via INTRAVENOUS
  Filled 2020-08-14: qty 500

## 2020-08-14 MED ORDER — SODIUM CHLORIDE 0.9 % IV SOLN: INTRAVENOUS | Status: AC

## 2020-08-14 MED ORDER — VANCOMYCIN HCL IN DEXTROSE 1-5 GM/200ML-% IV SOLN: 1000.0000 mg | Freq: Once | INTRAVENOUS | Status: DC

## 2020-08-14 MED ORDER — SODIUM CHLORIDE 0.9 % IV SOLN
500.0000 mg | INTRAVENOUS | Status: DC
  Administered 2020-08-15 – 2020-08-16 (×2): 500 mg via INTRAVENOUS
  Filled 2020-08-14 (×2): qty 500

## 2020-08-14 MED ORDER — ENOXAPARIN SODIUM 40 MG/0.4ML ~~LOC~~ SOLN
40.0000 mg | SUBCUTANEOUS | Status: DC
  Administered 2020-08-14 – 2020-08-15 (×2): 40 mg via SUBCUTANEOUS
  Filled 2020-08-14 (×2): qty 0.4

## 2020-08-14 NOTE — H&P (Signed)
History and Physical:    Isaiah Todd   IZT:[id number] DOB: 31-Jan-1951 DOA: 08/14/2020  Referring MD/provider: Dr. Ashok Cordia PCP: Lajean Manes, MD   Patient coming from: Home  Chief Complaint: Fevers, shortness of breath and left upper quadrant pain  History of Present Illness:   Isaiah Todd is an 69 y.o. male with PMH significant for prostate cancer and history of likely provoked DVT status post treatment for 1 year with Eliquis, discontinued April 2021 who was in his USO H until 10 days ago when he developed URI symptoms while caring for his grandchildren.  7 days ago patient got tested for Covid which was negative.  5 days ago patient developed a cough which was hacking and minimally productive.  Patient continued to do his usual ADLs including his usual 2 to 3 mile walk and working in the yard without difficulty.  Yesterday he noted he had a fever to 101 and was feeling tired.  This morning patient woke up with left upper quadrant discomfort and continued fever so decided to come to the ED.  On arrival to the ED, patient noted that he was short of breath and was having difficult time walking due to DOE.  Patient denies any history of COPD or CHF.  Denies any cardiac illnesses at all, no MI or HTN.  Patient denies any tobacco use or any other inhaled substance use.  His parents were both significant smokers in his youth, he lived with them till he was 64.   ED Course:  The patient was noted to be febrile, tachycardic and hypotensive.  Lactic acidosis came back at 4.9.  Chest x-ray was notable for left lower lobe infiltrate.  Abdominal exam was benign so no further testing was done.  Creatinine was noted to be 1.98.  Patient was diagnosed with sepsis secondary to left lower lobe pneumonia and lactic acidosis.  He was treated with aggressive fluid resuscitation and ceftriaxone and azithromycin.  Blood pressures were initially slow to improve despite aggressive fluid resuscitation  although MAP remained greater than 65.  Patient is admitted to progressive care unit for ongoing treatment of sepsis.  ROS:   ROS   Review of Systems: As per HPI.  Patient does admit to a couple of episodes of diarrhea yesterday.  No vomiting but he did have nausea.  No headache syncope presyncope or dizziness.  Past Medical History:   Past Medical History:  Diagnosis Date  . Arthritis    knees  . BPH (benign prostatic hyperplasia)    no current med.  . Cancer [facility])    prostate  . 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 Dental crowns present   . DVT prophylaxis   . Medial meniscus tear 08/2015   right knee    Past Surgical History:   Past Surgical History:  Procedure Laterality Date  . CHONDROPLASTY Left 04/09/2015   Procedure: CHONDROPLASTY;  Surgeon: Kathryne Hitch, MD;  Location: [place];  Service: Orthopedics;  Laterality: Left;  . COLONOSCOPY    . HERNIA REPAIR     umbilical  . HERNIA REPAIR    . KNEE ARTHROSCOPY WITH MEDIAL MENISECTOMY Left 04/09/2015   Procedure: LEFT KNEE ARTHROSCOPY CHONDROPLASTY, PARIAL MEDIAL   MENISCECTOMY;  Surgeon: Kathryne Hitch, MD;  Location: [facility];  Service: Orthopedics;  Laterality: Left;  . KNEE ARTHROSCOPY WITH MEDIAL MENISECTOMY Right 08/27/2015   Procedure: RIGHT KNEE ARTHROSCOPY, CHONDROPLASTY WITH PARTIAL MEDIAL MENISCECTOMY;  Surgeon: Ninetta Lights,  MD;  Location: [place];  Service: Orthopedics;  Laterality: Right;  . PROSTATECTOMY    . SCROTAL EXPLORATION    . VASECTOMY      Social History:   Social History   Socioeconomic History  . Marital status: Married    Spouse name: Not on file  . Number of children: Not on file  . Years of education: Not on file  . Highest education level: Not on file  Occupational History  . Not on file  Tobacco Use  . Smoking status: Never Smoker  . Smokeless tobacco: Never Used  Substance and Sexual Activity  . Alcohol use: Yes     Comment: occasionally  . Drug use: No  . Sexual activity: Yes  Other Topics Concern  . Not on file  Social History Narrative  . Not on file   Social Determinants of Health   Financial Resource Strain:   . Difficulty of Paying Living Expenses: Not on file  Food Insecurity:   . Worried About Charity fundraiser in the Last Year: Not on file  . Ran Out of Food in the Last Year: Not on file  Transportation Needs:   . Lack of Transportation (Medical): Not on file  . Lack of Transportation (Non-Medical): Not on file  Physical Activity:   . Days of Exercise per Week: Not on file  . Minutes of Exercise per Session: Not on file  Stress:   . Feeling of Stress : Not on file  Social Connections:   . Frequency of Communication with Friends and Family: Not on file  . Frequency of Social Gatherings with Friends and Family: Not on file  . Attends Religious Services: Not on file  . Active Member of Clubs or Organizations: Not on file  . Attends Archivist Meetings: Not on file  . Marital Status: Not on file  Intimate Partner Violence:   . Fear of Current or Ex-Partner: Not on file  . Emotionally Abused: Not on file  . Physically Abused: Not on file  . Sexually Abused: Not on file    Allergies   Patient has no known allergies.  Family history:   No family history on file.  Current Medications:   Prior to Admission medications   Medication Sig Start Date End Date Taking? Authorizing Provider  Multiple Vitamin (MULTIVITAMIN) tablet Take 1 tablet by mouth daily.    [provider]    Physical Exam:   Vitals:   08/14/20 0649 08/14/20 0740 08/14/20 0809 08/14/20 1045  BP: 94/61  91/67 (!) 108/93  Pulse: 94  98 (!) 109  Resp: 18  (!) 27 20  Temp: 97.9 F (36.6 C)     TempSrc: Oral  Oral   SpO2: 97% 96% 97% 98%  Weight:      Height:         Physical Exam: Blood pressure (!) 108/93, pulse (!) 109, temperature 97.9 F (36.6 C), temperature source Oral,  resp. rate 20, height 5' 6.5" (1.689 m), weight 95.3 kg, SpO2 98 %. Gen: Patient with tachypnea and difficulty speaking in full sentences although O2 sats remained 97 to 99 percent on room air. Eyes: sclera anicteric, conjuctiva mildly injected bilaterally CVS: S1-S2, regulary, no gallops Respiratory: Somewhat decreased air entry bilaterally with fine inspiratory rales throughout.  He has some rhonchi at the left base although not very impressive.  No rales. GI: NABS, soft, NT  LE: No edema. No cyanosis Neuro: A/O x 3, Moving  all extremities equally with normal strength, CN 3-12 intact, grossly nonfocal.  Psych: patient is logical and coherent, judgement and insight appear normal, mood and affect appropriate to situation. Skin: no rashes or lesions or ulcers,    Data Review:    Labs: Basic Metabolic Panel: Recent Labs  Lab 08/14/20 0700  NA 134*  K 4.1  CL 100  CO2 18*  GLUCOSE 129*  BUN 23  CREATININE 1.95*  CALCIUM 8.9   Liver Function Tests: Recent Labs  Lab 08/14/20 0700  AST 23  ALT 19  ALKPHOS 57  BILITOT 2.0*  PROT 6.6  ALBUMIN 3.3*   Recent Labs  Lab 08/14/20 0700  LIPASE 26   No results for input(s): AMMONIA in the last 168 hours. CBC: Recent Labs  Lab 08/14/20 0700  WBC 7.7  HGB 14.1  HCT 42.9  MCV 94.3  PLT 151   Cardiac Enzymes: No results for input(s): CKTOTAL, CKMB, CKMBINDEX, TROPONINI in the last 168 hours.  BNP (last 3 results) No results for input(s): PROBNP in the last 8760 hours. CBG: No results for input(s): GLUCAP in the last 168 hours.  Urinalysis No results found for: COLORURINE, APPEARANCEUR, LABSPEC, PHURINE, GLUCOSEU, HGBUR, BILIRUBINUR, KETONESUR, PROTEINUR, UROBILINOGEN, NITRITE, LEUKOCYTESUR    Radiographic Studies: DG Chest 2 View  Result Date: 08/14/2020 CLINICAL DATA:  Cough EXAM: CHEST - 2 VIEW COMPARISON:  None. FINDINGS: Airspace disease that is confluent in the left lower lobe. No right-sided opacity is seen.  Normal heart size and aortic contours. No air leak. Artifact from EKG leads. IMPRESSION: Left lower lobar pneumonia. Electronically Signed   By: Monte Fantasia M.D.   On: 08/14/2020 07:27    EKG: Independently reviewed.  Sinus rhythm at 90.  Normal intervals.  Normal axis.  No acute ST-T wave changes.   Assessment/Plan:   Principal Problem:   Sepsis ([place]) Active Problems:   CAP (community acquired pneumonia)   Prostate CA ([place])   History of deep vein thrombosis (DVT) of lower extremity  69 year old man, former pharmacist at [employer], is admitted with CAP complicated by sepsis and lactic acidosis.  Sepsis secondary to lactic acidosis secondary to CAP, Covid negative Patient's blood pressure was soft despite 2 and half liters of IV fluids with a MAP greater than 65. Continue aggressive IV fluid resuscitation. Treat with ceftriaxone and azithromycin already started in ED. Patient has significant cough and difficulty breathing when I saw him however his O2 sats were 98 to 99%. 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 Will give him duo nebs every 6 hours x4 doses.  History of DVT Patient completed 1 year course of Eliquis, discontinued April 2021  Well score for PE is 3 which gives him a 16% chance of PE. Unable to do CTA given AKI and creatinine of 2. Will order left lower extremity ultrasound and D-dimer. I have not started anticoagulation on him as my clinical suspicion is lower  Acute kidney injury Patient notes his baseline creatinine is 1.1-1.2 Anticipate improvement with treatment of infection and aggressive fluid resuscitation  Prostate Ca Outpatient treatment is warranted   Other information:   DVT prophylaxis: Enoxaparin ordered. Code Status: Full Family Communication: Patient's wife was at bedside throughout Disposition Plan: Home Consults called: Home Admission status: Inpatient  Letonia Stead Tublu Layna Roeper [facility]  If  7PM-7AM, please contact night-coverage [email or web address] Password [email or web address] 08/14/2020, 11:07 AM

## 2020-08-14 NOTE — Progress Notes (Signed)
Pharmacy Antibiotic Note  Isaiah Todd is a 69 y.o. male admitted on 08/14/2020 with pneumonia.  Pharmacy has been consulted for vancomycin dosing.  Pna on cxr: LA 4.9, SCr 1.95 no hx of CKD.  Ceftriaxone and azithromycin per MD.  Plan: Vancomycin 1500 mg IV x 1, then variable dosing due to unstable renal function Add MRSA PCR Monitor renal function, Cx/PCR and clinical progression to narrow Vancomycin trough at steady state  Height: 5' 6.5" (168.9 cm) Weight: 95.3 kg (210 lb) IBW/kg (Calculated) : 64.95  Temp (24hrs), Avg:97.9 F (36.6 C), Min:97.9 F (36.6 C), Max:97.9 F (36.6 C)  Recent Labs  Lab 08/14/20 0700 08/14/20 0756  WBC 7.7  --   CREATININE 1.95*  --   LATICACIDVEN  --  4.9*    Estimated Creatinine Clearance: 40.1 mL/min (A) (by C-G formula based on SCr of 1.95 mg/dL (H)).    No Known Allergies  [name], PharmD Clinical Pharmacist ED Pharmacist Phone # [phone] 08/14/2020 10:03 AM

## 2020-08-14 NOTE — ED Triage Notes (Signed)
C/o abd. Pain with fever  Onset 24 hours ago, states he as nausea with 3 episodes of diarrhea.

## 2020-08-14 NOTE — ED Notes (Signed)
Patient transported to CT 

## 2020-08-14 NOTE — ED Provider Notes (Signed)
[facility] EMERGENCY DEPARTMENT Provider Note   CSN: [id number] Arrival date & time: 08/14/20  8938     History Chief Complaint  Patient presents with  . Abdominal Pain  . Fever    Isaiah Todd is a 69 y.o. male.  Patient c/o non productive cough, general malaise/weakness, sob, fatigue, fever, in the past several days. Symptoms acute onset, moderate, constant, persistent. Had upper abd gas type feeling/fullness with upper abd discomfort, and a couple diarrhea stools in past day, but currently denies any abd pain or discomfort. No vomiting. Decreased po intake, and states feels dry. Denies sore throat or runny nose. +body aches. No known covid exposure and states covid test this past week was negative. Denies dysuria. No severe headache. No neck pain/stiffness.   The history is provided by the patient.  Abdominal Pain Associated symptoms: cough, fever and shortness of breath   Associated symptoms: no chest pain, no dysuria, no sore throat and no vomiting   Fever Associated symptoms: cough and myalgias   Associated symptoms: no chest pain, no confusion, no dysuria, no headaches, no rash, no sore throat and no vomiting        Past Medical History:  Diagnosis Date  . Arthritis    knees  . BPH (benign prostatic hyperplasia)    no current med.  . Cancer [facility])    prostate  . 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 Dental crowns present   . DVT prophylaxis   . Medial meniscus tear 08/2015   right knee    There are no problems to display for this patient.   Past Surgical History:  Procedure Laterality Date  . CHONDROPLASTY Left 04/09/2015   Procedure: CHONDROPLASTY;  Surgeon: Kathryne Hitch, MD;  Location: [place];  Service: Orthopedics;  Laterality: Left;  . COLONOSCOPY    . HERNIA REPAIR     umbilical  . HERNIA REPAIR    . KNEE ARTHROSCOPY WITH MEDIAL MENISECTOMY Left 04/09/2015   Procedure: LEFT KNEE ARTHROSCOPY CHONDROPLASTY,  PARIAL MEDIAL   MENISCECTOMY;  Surgeon: Kathryne Hitch, MD;  Location: [place];  Service: Orthopedics;  Laterality: Left;  . KNEE ARTHROSCOPY WITH MEDIAL MENISECTOMY Right 08/27/2015   Procedure: RIGHT KNEE ARTHROSCOPY, CHONDROPLASTY WITH PARTIAL MEDIAL MENISCECTOMY;  Surgeon: Ninetta Lights, MD;  Location: [place];  Service: Orthopedics;  Laterality: Right;  . PROSTATECTOMY    . SCROTAL EXPLORATION    . VASECTOMY         No family history on file.  Social History   Tobacco Use  . Smoking status: Never Smoker  . Smokeless tobacco: Never Used  Substance Use Topics  . Alcohol use: Yes    Comment: occasionally  . Drug use: No    Home Medications Prior to Admission medications   Medication Sig Start Date End Date Taking? Authorizing Provider  Multiple Vitamin (MULTIVITAMIN) tablet Take 1 tablet by mouth daily.    [provider]  ondansetron (ZOFRAN) 4 MG tablet Take 1 tablet (4 mg total) by mouth every 8 (eight) hours as needed for nausea or vomiting. 08/27/15   Aundra Dubin, PA-C  oxyCODONE-acetaminophen (ROXICET) 5-325 MG tablet Take 1-2 tablets by mouth every 4 (four) hours as needed. 08/27/15   Aundra Dubin, PA-C    Allergies    Patient has no known allergies.  Review of Systems   Review of Systems  Constitutional: Positive for fever.  HENT: Negative for sore throat.  Eyes: Negative for redness.  Respiratory: Positive for cough and shortness of breath.   Cardiovascular: Negative for chest pain and leg swelling.  Gastrointestinal: Positive for abdominal pain. Negative for vomiting.  Endocrine: Negative for polyuria.  Genitourinary: Negative for dysuria and flank pain.  Musculoskeletal: Positive for myalgias. Negative for back pain, neck pain and neck stiffness.  Skin: Negative for rash.  Neurological: Negative for headaches.  Hematological: Does not bruise/bleed easily.  Psychiatric/Behavioral: Negative for  confusion.    Physical Exam Updated Vital Signs BP 94/61 (BP Location: Left Arm)   Pulse 94   Temp 97.9 F (36.6 C) (Oral)   Resp 18   Ht 1.689 m (5' 6.5")   Wt 95.3 kg   SpO2 97%   BMI 33.39 kg/m   Physical Exam Vitals and nursing note reviewed.  Constitutional:      Appearance: Normal appearance. He is well-developed.  HENT:     Head: Atraumatic.     Nose: Nose normal.     Mouth/Throat:     Mouth: Mucous membranes are moist.     Pharynx: Oropharynx is clear.  Eyes:     General: No scleral icterus.    Conjunctiva/sclera: Conjunctivae normal.  Neck:     Trachea: No tracheal deviation.  Cardiovascular:     Rate and Rhythm: Normal rate and regular rhythm.     Pulses: Normal pulses.     Heart sounds: Normal heart sounds. No murmur heard.  No friction rub. No gallop.   Pulmonary:     Effort: Pulmonary effort is normal. No accessory muscle usage or respiratory distress.     Breath sounds: Rhonchi present.     Comments: LLL rales Abdominal:     General: Bowel sounds are normal. There is no distension.     Palpations: Abdomen is soft.     Tenderness: There is no abdominal tenderness. There is no guarding.  Genitourinary:    Comments: No cva tenderness. Musculoskeletal:        General: No swelling or tenderness.     Cervical back: Normal range of motion and neck supple. No rigidity.  Skin:    General: Skin is warm and dry.     Findings: No rash.  Neurological:     Mental Status: He is alert.     Comments: Alert, speech clear.   Psychiatric:        Mood and Affect: Mood normal.     ED Results / Procedures / Treatments   Labs (all labs ordered are listed, but only abnormal results are displayed) Results for orders placed or performed during the hospital encounter of 08/14/20  Lipase, blood  Result Value Ref Range   Lipase 26 11 - 51 U/L  Comprehensive metabolic panel  Result Value Ref Range   Sodium 134 (L) 135 - 145 mmol/L   Potassium 4.1 3.5 - 5.1 mmol/L     Chloride 100 98 - 111 mmol/L   CO2 18 (L) 22 - 32 mmol/L   Glucose, Bld 129 (H) 70 - 99 mg/dL   BUN 23 8 - 23 mg/dL   Creatinine, Ser 1.95 (H) 0.61 - 1.24 mg/dL   Calcium 8.9 8.9 - 10.3 mg/dL   Total Protein 6.6 6.5 - 8.1 g/dL   Albumin 3.3 (L) 3.5 - 5.0 g/dL   AST 23 15 - 41 U/L   ALT 19 0 - 44 U/L   Alkaline Phosphatase 57 38 - 126 U/L   Total Bilirubin 2.0 (H) 0.3 - 1.2  mg/dL   GFR calc non Af Amer 35 (L) >60 mL/min   Anion gap 16 (H) 5 - 15  CBC  Result Value Ref Range   WBC 7.7 4.0 - 10.5 K/uL   RBC 4.55 4.22 - 5.81 MIL/uL   Hemoglobin 14.1 13.0 - 17.0 g/dL   HCT 42.9 39 - 52 %   MCV 94.3 80.0 - 100.0 fL   MCH 31.0 26.0 - 34.0 pg   MCHC 32.9 30.0 - 36.0 g/dL   RDW 13.2 11.5 - 15.5 %   Platelets 151 150 - 400 K/uL   nRBC 0.0 0.0 - 0.2 %  Lactic acid, plasma  Result Value Ref Range   Lactic Acid, Venous 4.9 (HH) 0.5 - 1.9 mmol/L   DG Chest 2 View  Result Date: 08/14/2020 CLINICAL DATA:  Cough EXAM: CHEST - 2 VIEW COMPARISON:  None. FINDINGS: Airspace disease that is confluent in the left lower lobe. No right-sided opacity is seen. Normal heart size and aortic contours. No air leak. Artifact from EKG leads. IMPRESSION: Left lower lobar pneumonia. Electronically Signed   By: Monte Fantasia M.D.   On: 08/14/2020 07:27    EKG EKG Interpretation  Date/Time:  Friday August 14 2020 07:10:13 EDT Ventricular Rate:  92 PR Interval:    QRS Duration: 98 QT Interval:  324 QTC Calculation: 401 R Axis:   9 Text Interpretation: Sinus rhythm No previous tracing Confirmed by Lajean Saver [phone]) on 08/14/2020 9:01:43 AM   Radiology DG Chest 2 View  Result Date: 08/14/2020 CLINICAL DATA:  Cough EXAM: CHEST - 2 VIEW COMPARISON:  None. FINDINGS: Airspace disease that is confluent in the left lower lobe. No right-sided opacity is seen. Normal heart size and aortic contours. No air leak. Artifact from EKG leads. IMPRESSION: Left lower lobar pneumonia. Electronically Signed   By:  Monte Fantasia M.D.   On: 08/14/2020 07:27    Procedures Procedures (including critical care time)  Medications Ordered in ED Medications  cefTRIAXone (ROCEPHIN) 1 g in sodium chloride 0.9 % 100 mL IVPB (has no administration in time range)  azithromycin (ZITHROMAX) 500 mg in sodium chloride 0.9 % 250 mL IVPB (has no administration in time range)  sodium chloride 0.9 % bolus 1,000 mL (has no administration in time range)    ED Course  I have reviewed the triage vital signs and the nursing notes.  Pertinent labs & imaging results that were available during my care of the patient were reviewed by me and considered in my medical decision making (see chart for details).    MDM Rules/Calculators/A&P                          Iv ns bolus. Stat labs. Cultures. Cxr. Continuous pulse ox and cardiac monitoring - sinus rhythm.  MDM Number of Diagnoses or Management Options   Amount and/or Complexity of Data Reviewed Clinical lab tests: ordered and reviewed Tests in the radiology section of CPT: ordered and reviewed Tests in the medicine section of CPT: ordered and reviewed Discussion of test results with the performing providers: yes Decide to obtain previous medical records or to obtain history from someone other than the patient: yes Obtain history from someone other than the patient: yes Review and summarize past medical records: yes Discuss the patient with other providers: yes Independent visualization of images, tracings, or specimens: yes  Risk of Complications, Morbidity, and/or Mortality Presenting problems: high Diagnostic procedures: high Management options: high  Reviewed nursing notes and prior charts for additional history.   Ivf, cultures sent.  CXR reviewed/interpreted by me - +LLL pna. Iv antibiotics given.   Covid swab sent.   Recheck pt, no cp, no abd pain. abd soft nt. Breathing comfortably.   Labs reviewed/interpreted by me - cr 1.95 - prior cr noted  to be 1.5, current labs c/w dehydration, AKI. Iv ns bolus.    Additional labs reviewed/interpreted by me - lactate high. Additional iv ns. Pt given total of 3 liters NS (> 30 cc/kg bolus).   Will consult medicine for admission.  CRITICAL CARE RE: severe sepsis, pneumonia, AKI.  Performed by: Mirna Mires Total critical care time: 45 minutes Critical care time was exclusive of separately billable procedures and treating other patients. Critical care was necessary to treat or prevent imminent or life-threatening deterioration. Critical care was time spent personally by me on the following activities: development of treatment plan with patient and/or surrogate as well as nursing, discussions with consultants, evaluation of patient's response to treatment, examination of patient, obtaining history from patient or surrogate, ordering and performing treatments and interventions, ordering and review of laboratory studies, ordering and review of radiographic studies, pulse oximetry and re-evaluation of patient's condition.     Final Clinical Impression(s) / ED Diagnoses Final diagnoses:  None    Rx / DC Orders ED Discharge Orders    None       Lajean Saver, MD 08/14/20 [phone]

## 2020-08-15 DIAGNOSIS — J189 Pneumonia, unspecified organism: Secondary | ICD-10-CM | POA: Diagnosis not present

## 2020-08-15 DIAGNOSIS — R6520 Severe sepsis without septic shock: Secondary | ICD-10-CM

## 2020-08-15 DIAGNOSIS — R6521 Severe sepsis with septic shock: Secondary | ICD-10-CM | POA: Diagnosis not present

## 2020-08-15 DIAGNOSIS — A419 Sepsis, unspecified organism: Secondary | ICD-10-CM | POA: Diagnosis not present

## 2020-08-15 LAB — BASIC METABOLIC PANEL
Anion gap: 10 (ref 5–15)
BUN: 22 mg/dL (ref 8–23)
CO2: 19 mmol/L — ABNORMAL LOW (ref 22–32)
Calcium: 7.4 mg/dL — ABNORMAL LOW (ref 8.9–10.3)
Chloride: 109 mmol/L (ref 98–111)
Creatinine, Ser: 1.45 mg/dL — ABNORMAL HIGH (ref 0.61–1.24)
GFR, Estimated: 49 mL/min — ABNORMAL LOW (ref >60)
Glucose, Bld: 89 mg/dL (ref 70–99)
Potassium: 3.6 mmol/L (ref 3.5–5.1)
Sodium: 138 mmol/L (ref 135–145)

## 2020-08-15 LAB — CBC
HCT: 34.6 % — ABNORMAL LOW (ref 39.0–52.0)
Hemoglobin: 11.4 g/dL — ABNORMAL LOW (ref 13.0–17.0)
MCH: 30.3 pg (ref 26.0–34.0)
MCHC: 32.9 g/dL (ref 30.0–36.0)
MCV: 92 fL (ref 80.0–100.0)
Platelets: 102 10*3/uL — ABNORMAL LOW (ref 150–400)
RBC: 3.76 MIL/uL — ABNORMAL LOW (ref 4.22–5.81)
RDW: 13.4 % (ref 11.5–15.5)
WBC: 8.5 10*3/uL (ref 4.0–10.5)
nRBC: 0 % (ref 0.0–0.2)

## 2020-08-15 NOTE — Progress Notes (Signed)
[facility] PROGRESS NOTE    Progress Note  Isaiah Todd  PPI:[id number] DOB: 04-13-1951 DOA: 08/14/2020 PCP: Isaiah Manes, MD     Brief Narrative:   Isaiah Todd is an 69 y.o. 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  Assessment/Plan:   Sepsis secondarily to CAP (community acquired pneumonia) SARS-CoV-2 PCR was negative. She was fluid resuscitated and started empirically on antibiotics. Saturations have remained greater than 92% on room air. Blood cultures have been sent continue inhalers as needed.  History of DVT: D-dimer will likely be elevated due to her infectious etiology, lower extremity Dopplers negative for DVT. Very low likelihood of PE he is having pleuritic chest pain with an infiltrate on chest x-ray, fever productive cough.  Kidney injury: With a baseline creatinine of 1.1 likely prerenal azotemia we will hydrate her aggressively and recheck in the morning there is likely due to sepsis.  Watery stools Likely due to infectious etiology plus minus antibiotics we will continue to monitor.   Prostate CA ([place]) Noted.  DVT prophylaxis: lovenxo Family Communication:none Status is: Inpatient  Remains inpatient appropriate because:Hemodynamically unstable   Dispo: The patient is from: Home               Anticipated d/c is to: Home              Anticipated d/c date is: 1 day              Patient currently is not medically stable to d/c.        Code Status:     Code Status Orders  (From admission, onward)         Start     Ordered   08/14/20 1114  Full code  Continuous        08/14/20 1117        Code Status History    Date Active Date Inactive Code Status Order ID Comments User Context   08/27/2015 0949 08/27/2015 1410 Full Code [id number]  Isaiah Todd Inpatient   Advance Care Planning Activity        IV Access:    Peripheral IV   Procedures and diagnostic studies:   DG Chest 2 View  Result Date: 08/14/2020 CLINICAL DATA:  Cough EXAM: CHEST - 2 VIEW COMPARISON:  None. FINDINGS: Airspace disease that is confluent in the left lower lobe. No right-sided opacity is seen. Normal heart size and aortic contours. No air leak. Artifact from EKG leads. IMPRESSION: Left lower lobar pneumonia. Electronically Signed   By: Monte Fantasia M.D.   On: 08/14/2020 07:27   VAS [place] LOWER EXTREMITY VENOUS (DVT)  Result Date: 08/14/2020  Lower Venous DVTStudy Indications: Hx DVT 1 year ago- study at outside facility- patient has been on Eliquis. Now with new SOB.  Comparison Study: No prior Performing Technologist: June Leap RDMS,  RVT  Examination Guidelines: A complete evaluation includes B-mode imaging, spectral Doppler, color Doppler, and power Doppler as needed of all accessible portions of each vessel. Bilateral testing is considered an integral part of a complete examination. Limited examinations for reoccurring indications may be performed as noted. The reflux portion of the exam is performed with the patient in reverse Trendelenburg.  +-----+---------------+---------+-----------+----------+--------------+ RIGHTCompressibilityPhasicitySpontaneityPropertiesThrombus Aging +-----+---------------+---------+-----------+----------+--------------+ CFV  Full           Yes       Yes                                 +-----+---------------+---------+-----------+----------+--------------+   +---------+---------------+---------+-----------+----------+--------------+ LEFT     CompressibilityPhasicitySpontaneityPropertiesThrombus Aging +---------+---------------+---------+-----------+----------+--------------+ CFV      Full           Yes      Yes                                 +---------+---------------+---------+-----------+----------+--------------+ SFJ      Full                                                        +---------+---------------+---------+-----------+----------+--------------+ FV Prox  Full                                                        +---------+---------------+---------+-----------+----------+--------------+ FV Mid   Full                                                        +---------+---------------+---------+-----------+----------+--------------+ FV DistalFull                                                        +---------+---------------+---------+-----------+----------+--------------+ PFV      Full                                                        +---------+---------------+---------+-----------+----------+--------------+ POP      Full           Yes      Yes                                 +---------+---------------+---------+-----------+----------+--------------+ PTV      Full                                                        +---------+---------------+---------+-----------+----------+--------------+  PERO     Full                                                        +---------+---------------+---------+-----------+----------+--------------+     Summary: RIGHT: - No evidence of common femoral vein obstruction.  LEFT: - There is no evidence of deep vein thrombosis in the lower extremity. -Previous DVT appears to have resolved. - No cystic structure found in the popliteal  fossa.  *See table(s) above for measurements and observations. Electronically signed by Servando Snare MD on 08/14/2020 at 5:30:21 PM.    Final      Medical Consultants:    None.  Anti-Infectives:   Rocephin and azithromycin  Subjective:    Isaiah Todd feels much better than yesterday no new complaints this morning.  Objective:    Vitals:   08/14/20 2004 08/14/20 2239 08/15/20 0021 08/15/20 0423  BP: 121/66  105/64 107/68  Pulse: 95  88 87  Resp: (!) 21  (!) 22 20  Temp: 99.3 F (37.4 C) 98.6 F (37 C) 98.3 F (36.8 C) 98.7 F (37.1 C)  TempSrc: Oral Oral Oral Oral  SpO2: 97%  97% 98%  Weight:   99.9 kg   Height:       SpO2: 98 % O2 Flow Rate (L/min): 2 L/min (Stby) FiO2 (%): 28 %   Intake/Output Summary (Last 24 hours) at 08/15/2020 0736 Last data filed at 08/15/2020 0600 Gross per 24 hour  Intake 4997.15 ml  Output 2400 ml  Net 2597.15 ml   Filed Weights   08/14/20 0647 08/15/20 0021  Weight: 95.3 kg 99.9 kg    Exam: General exam: In no acute distress. Respiratory system: Good air movement with crackles on the left  lower lobe Cardiovascular system: S1 & S2 heard, RRR. No JVD. Gastrointestinal system: Abdomen is nondistended, soft and nontender.  Central nervous system: Alert and oriented. No focal neurological deficits. Psychiatry: Judgement and insight appear normal. Mood & affect appropriate.    Data Reviewed:    Labs: Basic Metabolic Panel: Recent Labs  Lab 08/14/20 0700 08/14/20 1113 08/15/20 0354  NA 134*  --  138  K 4.1  --  3.6  CL 100  --  109  CO2 18*  --  19*  GLUCOSE 129*  --  89  BUN 23  --  22  CREATININE 1.95* 2.00* 1.45*  CALCIUM 8.9  --  7.4*   GFR Estimated Creatinine Clearance: 55.2 mL/min (A) (by C-G formula based on SCr of 1.45 mg/dL (H)). Liver Function Tests: Recent Labs  Lab 08/14/20 0700  AST 23  ALT 19  ALKPHOS 57  BILITOT 2.0*  PROT 6.6  ALBUMIN 3.3*   Recent Labs  Lab 08/14/20 0700  LIPASE 26    No results for input(s): AMMONIA in the last 168 hours. Coagulation profile No results for input(s): INR, PROTIME in the last 168 hours. COVID-19 Labs  Recent Labs    08/14/20 1145  DDIMER 1.41*    Lab Results  Component Value Date   SARSCOV2NAA NEGATIVE 08/14/2020   [place] Not Detected 10/15/2019    CBC: Recent Labs  Lab 08/14/20 0700 08/14/20 1113 08/15/20 0354  WBC 7.7 7.7 8.5  HGB 14.1 12.0* 11.4*  HCT 42.9 38.4* 34.6*  MCV 94.3 96.0 92.0  PLT 151 PLATELET  CLUMPS NOTED ON SMEAR, COUNT APPEARS DECREASED 102*   Cardiac Enzymes: No results for input(s): CKTOTAL, CKMB, CKMBINDEX, TROPONINI in the last 168 hours. BNP (last 3 results) No results for input(s): PROBNP in the last 8760 hours. CBG: No results for input(s): GLUCAP in the last 168 hours. D-Dimer: Recent Labs    08/14/20 1145  DDIMER 1.41*   Hgb A1c: No results for input(s): HGBA1C in the last 72 hours. Lipid Profile: No results for input(s): CHOL, HDL, LDLCALC, TRIG, CHOLHDL, LDLDIRECT in the last 72 hours. Thyroid function studies: No results for input(s): TSH, T4TOTAL, T3FREE, THYROIDAB in the last 72 hours.  Invalid input(s): FREET3 Anemia work up: No results for input(s): VITAMINB12, FOLATE, FERRITIN, TIBC, IRON, RETICCTPCT in the last 72 hours. Sepsis Labs: Recent Labs  Lab 08/14/20 0700 08/14/20 0756 08/14/20 1009 08/14/20 1113 08/14/20 1325 08/15/20 0354  WBC 7.7  --   --  7.7  --  8.5  LATICACIDVEN  --  4.9* 4.3*  --  5.8*  --    Microbiology Recent Results (from the past 240 hour(s))  Blood Culture (routine x 2)     Status: None (Preliminary result)   Collection Time: 08/14/20  7:54 AM   Specimen: BLOOD  Result Value Ref Range Status   Specimen Description BLOOD RIGHT ANTECUBITAL  Final   Special Requests   Final    BOTTLES DRAWN AEROBIC AND ANAEROBIC Blood Culture results may not be optimal due to an excessive volume of blood received in culture bottles   Culture    Final    NO GROWTH < 24 HOURS Performed at [facility], [place] [address], [place], [place] [address]    Report Status PENDING  Incomplete  Blood Culture (routine x 2)     Status: None (Preliminary result)   Collection Time: 08/14/20  7:54 AM   Specimen: BLOOD LEFT FOREARM  Result Value Ref Range Status   Specimen Description BLOOD LEFT FOREARM  Final   Special Requests   Final    BOTTLES DRAWN AEROBIC AND ANAEROBIC Blood Culture results may not be optimal due to an excessive volume of blood received in culture bottles   Culture   Final    NO GROWTH < 24 HOURS Performed at [facility], [place] [address], [place], [place] [address]    Report Status PENDING  Incomplete  Respiratory Panel by RT PCR (Flu A&B, Covid) - Nasopharyngeal Swab     Status: None   Collection Time: 08/14/20  7:54 AM   Specimen: Nasopharyngeal Swab  Result Value Ref Range Status   SARS Coronavirus 2 by RT PCR NEGATIVE NEGATIVE Final    Comment: (NOTE) SARS-CoV-2 target nucleic acids are NOT DETECTED.  The SARS-CoV-2 RNA is generally detectable in upper respiratoy specimens during the acute phase of infection. The lowest concentration of SARS-CoV-2 viral copies this assay can detect is 131 copies/mL. A negative result does not preclude SARS-Cov-2 infection and should not be used as the sole basis for treatment or other patient management decisions. A negative result may occur with  improper specimen collection/handling, submission of specimen other than nasopharyngeal swab, presence of viral mutation(s) within the areas targeted by this assay, and inadequate number of viral copies (<131 copies/mL). A negative result must be combined with clinical observations, patient history, and epidemiological information. The expected result is Negative.  Fact Sheet for Patients:  PinkCheek.be  Fact Sheet for Healthcare Providers:  GravelBags.it  This  test is no t yet  approved or cleared by the [place] and  has been authorized for detection and/or diagnosis of SARS-CoV-2 by FDA under an Emergency Use Authorization (EUA). This EUA will remain  in effect (meaning this test can be used) for the duration of the COVID-19 declaration under Section 564(b)(1) of the Act, 21 U.S.C. section 360bbb-3(b)(1), unless the authorization is terminated or revoked sooner.     Influenza A by PCR NEGATIVE NEGATIVE Final   Influenza B by PCR NEGATIVE NEGATIVE Final    Comment: (NOTE) The Xpert Xpress SARS-CoV-2/FLU/RSV assay is intended as an aid in  the diagnosis of influenza from Nasopharyngeal swab specimens and  should not be used as a sole basis for treatment. Nasal washings and  aspirates are unacceptable for Xpert Xpress SARS-CoV-2/FLU/RSV  testing.  Fact Sheet for Patients: PinkCheek.be  Fact Sheet for Healthcare Providers: GravelBags.it  This test is not yet approved or cleared by the [place] FDA and  has been authorized for detection and/or diagnosis of SARS-CoV-2 by  FDA under an Emergency Use Authorization (EUA). This EUA will remain  in effect (meaning this test can be used) for the duration of the  Covid-19 declaration under Section 564(b)(1) of the Act, 21  U.S.C. section 360bbb-3(b)(1), unless the authorization is  terminated or revoked. Performed at [facility], [place] [address], [place], [place] [address]   MRSA PCR Screening     Status: Abnormal   Collection Time: 08/14/20 11:47 AM  Result Value Ref Range Status   MRSA by PCR POSITIVE (A) NEGATIVE Final    Comment:        The GeneXpert MRSA Assay (FDA approved for NASAL specimens only), is one component of a comprehensive MRSA colonization surveillance program. It is not intended to diagnose MRSA infection nor to guide or monitor treatment for MRSA infections. RESULT CALLED TO, READ BACK BY  AND VERIFIED WITH: R,HARDY @1355  08/14/20 EB Performed at [facility], [place] [address] [place], [place] [address]      Medications:   . dextromethorphan-guaiFENesin  1 tablet Oral BID  . enoxaparin (LOVENOX) injection  40 mg Subcutaneous Q24H  . ipratropium-albuterol  3 mL Nebulization Q6H   Continuous Infusions: . sodium chloride 150 mL/hr at 08/15/20 0720  . azithromycin    . cefTRIAXone (ROCEPHIN)  IV        LOS: 1 day   Charlynne Cousins  [facility]  08/15/2020, 7:36 AM

## 2020-08-16 DIAGNOSIS — R6521 Severe sepsis with septic shock: Secondary | ICD-10-CM | POA: Diagnosis not present

## 2020-08-16 DIAGNOSIS — J189 Pneumonia, unspecified organism: Secondary | ICD-10-CM | POA: Diagnosis not present

## 2020-08-16 DIAGNOSIS — Z86718 Personal history of other venous thrombosis and embolism: Secondary | ICD-10-CM

## 2020-08-16 DIAGNOSIS — A419 Sepsis, unspecified organism: Secondary | ICD-10-CM | POA: Diagnosis not present

## 2020-08-16 LAB — BASIC METABOLIC PANEL
Anion gap: 9 (ref 5–15)
BUN: 18 mg/dL (ref 8–23)
CO2: 19 mmol/L — ABNORMAL LOW (ref 22–32)
Calcium: 8.1 mg/dL — ABNORMAL LOW (ref 8.9–10.3)
Chloride: 109 mmol/L (ref 98–111)
Creatinine, Ser: 1.11 mg/dL (ref 0.61–1.24)
GFR, Estimated: >60 mL/min (ref >60)
Glucose, Bld: 86 mg/dL (ref 70–99)
Potassium: 3.9 mmol/L (ref 3.5–5.1)
Sodium: 137 mmol/L (ref 135–145)

## 2020-08-16 MED ORDER — AMOXICILLIN-POT CLAVULANATE 875-125 MG PO TABS: 1.0000 | ORAL_TABLET | Freq: Two times a day (BID) | ORAL | 0 refills | Status: AC

## 2020-08-16 MED ORDER — AZITHROMYCIN 500 MG PO TABS: 500.0000 mg | ORAL_TABLET | Freq: Every day | ORAL | 0 refills | Status: AC

## 2020-08-16 MED ORDER — DM-GUAIFENESIN ER 30-600 MG PO TB12: 1.0000 | ORAL_TABLET | Freq: Two times a day (BID) | ORAL | 0 refills | Status: DC

## 2020-08-16 MED ORDER — SODIUM CHLORIDE 0.9 % IV SOLN
INTRAVENOUS | Status: DC | PRN
  Administered 2020-08-16: 1000 mL via INTRAVENOUS

## 2020-08-16 NOTE — Progress Notes (Signed)
Patient discharged: Home with family  Via: Wheelchair   Discharge paperwork given: to patient and family  Reviewed with teach back  IV and telemetry disconnected  Belongings given to patient    

## 2020-08-16 NOTE — Discharge Summary (Addendum)
Physician Discharge Summary  Isaiah Todd RCV:[id number] DOB: 1950/11/15 DOA: 08/14/2020  PCP: Lajean Manes, MD  Admit date: 08/14/2020 Discharge date: 08/16/2020  Admitted From: Home Disposition:  Home  Recommendations for Outpatient Follow-up:  1. Follow up with PCP in 1 weeks   Home Health:No EQuipment/Devices:None  Discharge Condition:Stable CODE STATUS:Full Diet recommendation: Heart Healthy  Brief/Interim Summary: 69 y.o. 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  Discharge Diagnoses:  Principal Problem:   Sepsis ([place]) Active Problems:   CAP (community acquired pneumonia)   Prostate CA ([place])   History of deep vein thrombosis (DVT) of lower extremity Septic Shock present on admission secondary to community-acquired pneumonia: SARS-CoV-2 and influenza were negative. He was fluid status with empiric antibiotics culture data remain negative. He remained afebrile he was transitioned to azithromycin and Augmentin which should continue as an outpatient.  History of DVT: Lower extremity Doppler was negative for DVT.  Acute kidney injury: Likely prerenal azotemia due to sepsis with baseline creatinine 1.1, admission 1.4 returned to baseline after IV fluid hydration.  Watery stools: Likely due to infectious etiology now  resolved.  Prostate cancer: Negative follow-up with PCP.   Discharge Instructions  Discharge Instructions    Diet - low sodium heart healthy   Complete by: As directed    Increase activity slowly   Complete by: As directed      Allergies as of 08/16/2020   No Known Allergies     Medication List    TAKE these medications   amoxicillin-clavulanate 875-125 MG tablet Commonly known as: Augmentin Take 1 tablet by mouth 2 (two) times daily for 3 days.   azithromycin 500 MG tablet Commonly known as: Zithromax Take 1 tablet (500 mg total) by mouth daily for 3 days. Take 1 tablet daily for 3 days.   dextromethorphan-guaiFENesin 30-600 MG 12hr tablet Commonly known as: MUCINEX DM Take 1 tablet by mouth 2 (two) times daily.   multivitamin tablet Take 1 tablet by mouth daily.       No Known Allergies  Consultations:  None   Procedures/Studies: DG Chest 2 View  Result Date: 08/14/2020 CLINICAL DATA:  Cough EXAM: CHEST - 2 VIEW COMPARISON:  None. FINDINGS: Airspace disease that is confluent in the left lower lobe. No right-sided opacity is seen. Normal heart size and aortic contours. No air leak. Artifact from EKG leads. IMPRESSION: Left lower lobar pneumonia. Electronically Signed   By: Monte Fantasia M.D.   On: 08/14/2020 07:27   VAS [place] LOWER EXTREMITY VENOUS (DVT)  Result Date: 08/14/2020  Lower Venous DVTStudy Indications: Hx DVT 1 year ago- study at outside facility- patient has been on Eliquis. Now with new SOB.  Comparison Study: No prior Performing Technologist: June Leap RDMS, RVT  Examination Guidelines: A complete evaluation includes B-mode imaging, spectral Doppler, color Doppler, and power Doppler as needed of all accessible  portions of each vessel. Bilateral testing is considered an integral part of a complete examination. Limited examinations for reoccurring indications may be performed as noted. The reflux portion of the exam is performed with the patient in  reverse Trendelenburg.  +-----+---------------+---------+-----------+----------+--------------+ RIGHTCompressibilityPhasicitySpontaneityPropertiesThrombus Aging +-----+---------------+---------+-----------+----------+--------------+ CFV  Full           Yes      Yes                                 +-----+---------------+---------+-----------+----------+--------------+   +---------+---------------+---------+-----------+----------+--------------+ LEFT     CompressibilityPhasicitySpontaneityPropertiesThrombus Aging +---------+---------------+---------+-----------+----------+--------------+ CFV      Full           Yes      Yes                                 +---------+---------------+---------+-----------+----------+--------------+ SFJ      Full                                                        +---------+---------------+---------+-----------+----------+--------------+ FV Prox  Full                                                        +---------+---------------+---------+-----------+----------+--------------+ FV Mid   Full                                                        +---------+---------------+---------+-----------+----------+--------------+ FV DistalFull                                                        +---------+---------------+---------+-----------+----------+--------------+ PFV      Full                                                        +---------+---------------+---------+-----------+----------+--------------+ POP      Full           Yes      Yes                                 +---------+---------------+---------+-----------+----------+--------------+ PTV      Full                                                        +---------+---------------+---------+-----------+----------+--------------+ PERO     Full                                                         +---------+---------------+---------+-----------+----------+--------------+  Summary: RIGHT: - No evidence of common femoral vein obstruction.  LEFT: - There is no evidence of deep vein thrombosis in the lower extremity. -Previous DVT appears to have resolved. - No cystic structure found in the popliteal fossa.  *See table(s) above for measurements and observations. Electronically signed by Servando Snare MD on 08/14/2020 at 5:30:21 PM.    Final     (Echo, Carotid, EGD, Colonoscopy, ERCP)    Subjective: No new complaints feels great.  Discharge Exam: Vitals:   08/16/20 0539 08/16/20 0920  BP: (!) 148/93 130/84  Pulse: 84 79  Resp: 20   Temp: 98.1 F (36.7 C)   SpO2: 97% 96%   Vitals:   08/15/20 1943 08/16/20 0017 08/16/20 0539 08/16/20 0920  BP: (!) 141/88 130/81 (!) 148/93 130/84  Pulse: 81 75 84 79  Resp: 20 18 20    Temp: 98.2 F (36.8 C) 98.3 F (36.8 C) 98.1 F (36.7 C)   TempSrc: Oral Oral Oral   SpO2: 97% 99% 97% 96%  Weight:  100.3 kg    Height:        General: Pt is alert, awake, not in acute distress Cardiovascular: RRR, S1/S2 +, no rubs, no gallops Respiratory: CTA bilaterally, no wheezing, no rhonchi Abdominal: Soft, NT, ND, bowel sounds + Extremities: no edema, no cyanosis    The results of significant diagnostics from this hospitalization (including imaging, microbiology, ancillary and laboratory) are listed below for reference.     Microbiology: Recent Results (from the past 240 hour(s))  Blood Culture (routine x 2)     Status: None (Preliminary result)   Collection Time: 08/14/20  7:54 AM   Specimen: BLOOD  Result Value Ref Range Status   Specimen Description BLOOD RIGHT ANTECUBITAL  Final   Special Requests   Final    BOTTLES DRAWN AEROBIC AND ANAEROBIC Blood Culture results may not be optimal due to an excessive volume of blood received in culture bottles   Culture   Final    NO GROWTH < 24 HOURS Performed at [facility], [place]  [address], [place], [place] [address]    Report Status PENDING  Incomplete  Blood Culture (routine x 2)     Status: None (Preliminary result)   Collection Time: 08/14/20  7:54 AM   Specimen: BLOOD LEFT FOREARM  Result Value Ref Range Status   Specimen Description BLOOD LEFT FOREARM  Final   Special Requests   Final    BOTTLES DRAWN AEROBIC AND ANAEROBIC Blood Culture results may not be optimal due to an excessive volume of blood received in culture bottles   Culture   Final    NO GROWTH < 24 HOURS Performed at [facility], [place] [address] [place], [place] [address]    Report Status PENDING  Incomplete  Respiratory Panel by RT PCR (Flu A&B, Covid) - Nasopharyngeal Swab     Status: None   Collection Time: 08/14/20  7:54 AM   Specimen: Nasopharyngeal Swab  Result Value Ref Range Status   SARS Coronavirus 2 by RT PCR NEGATIVE NEGATIVE Final    Comment: (NOTE) SARS-CoV-2 target nucleic acids are NOT DETECTED.  The SARS-CoV-2 RNA is generally detectable in upper respiratoy specimens during the acute phase of infection. The lowest concentration of SARS-CoV-2 viral copies this assay can detect is 131 copies/mL. A negative result does not preclude SARS-Cov-2 infection and should not be used as the sole basis for treatment or other patient management decisions. A negative result  may occur with  improper specimen collection/handling, submission of specimen other than nasopharyngeal swab, presence of viral mutation(s) within the areas targeted by this assay, and inadequate number of viral copies (<131 copies/mL). A negative result must be combined with clinical observations, patient history, and epidemiological information. The expected result is Negative.  Fact Sheet for Patients:  PinkCheek.be  Fact Sheet for Healthcare Providers:  GravelBags.it  This test is no t yet approved or cleared by the [place] FDA and  has been  authorized for detection and/or diagnosis of SARS-CoV-2 by FDA under an Emergency Use Authorization (EUA). This EUA will remain  in effect (meaning this test can be used) for the duration of the COVID-19 declaration under Section 564(b)(1) of the Act, 21 U.S.C. section 360bbb-3(b)(1), unless the authorization is terminated or revoked sooner.     Influenza A by PCR NEGATIVE NEGATIVE Final   Influenza B by PCR NEGATIVE NEGATIVE Final    Comment: (NOTE) The Xpert Xpress SARS-CoV-2/FLU/RSV assay is intended as an aid in  the diagnosis of influenza from Nasopharyngeal swab specimens and  should not be used as a sole basis for treatment. Nasal washings and  aspirates are unacceptable for Xpert Xpress SARS-CoV-2/FLU/RSV  testing.  Fact Sheet for Patients: PinkCheek.be  Fact Sheet for Healthcare Providers: GravelBags.it  This test is not yet approved or cleared by the [place] FDA and  has been authorized for detection and/or diagnosis of SARS-CoV-2 by  FDA under an Emergency Use Authorization (EUA). This EUA will remain  in effect (meaning this test can be used) for the duration of the  Covid-19 declaration under Section 564(b)(1) of the Act, 21  U.S.C. section 360bbb-3(b)(1), unless the authorization is  terminated or revoked. Performed at [facility], [place] [address], [place], [place] [address]   MRSA PCR Screening     Status: Abnormal   Collection Time: 08/14/20 11:47 AM  Result Value Ref Range Status   MRSA by PCR POSITIVE (A) NEGATIVE Final    Comment:        The GeneXpert MRSA Assay (FDA approved for NASAL specimens only), is one component of a comprehensive MRSA colonization surveillance program. It is not intended to diagnose MRSA infection nor to guide or monitor treatment for MRSA infections. RESULT CALLED TO, READ BACK BY AND VERIFIED WITH: R,HARDY @1355  08/14/20 EB Performed at [facility], [place] [address], [place], [place] [address]      Labs: BNP (last 3 results) No results for input(s): BNP in the last 8760 hours. Basic Metabolic Panel: Recent Labs  Lab 08/14/20 0700 08/14/20 1113 08/15/20 0354 08/16/20 0252  NA 134*  --  138 137  K 4.1  --  3.6 3.9  CL 100  --  109 109  CO2 18*  --  19* 19*  GLUCOSE 129*  --  89 86  BUN 23  --  22 18  CREATININE 1.95* 2.00* 1.45* 1.11  CALCIUM 8.9  --  7.4* 8.1*   Liver Function Tests: Recent Labs  Lab 08/14/20 0700  AST 23  ALT 19  ALKPHOS 57  BILITOT 2.0*  PROT 6.6  ALBUMIN 3.3*   Recent Labs  Lab 08/14/20 0700  LIPASE 26   No results for input(s): AMMONIA in the last 168 hours. CBC: Recent Labs  Lab 08/14/20 0700 08/14/20 1113 08/15/20 0354  WBC 7.7 7.7 8.5  HGB 14.1 12.0* 11.4*  HCT 42.9 38.4* 34.6*  MCV 94.3 96.0 92.0  PLT  151 PLATELET CLUMPS NOTED ON SMEAR, COUNT APPEARS DECREASED 102*   Cardiac Enzymes: No results for input(s): CKTOTAL, CKMB, CKMBINDEX, TROPONINI in the last 168 hours. BNP: Invalid input(s): POCBNP CBG: No results for input(s): GLUCAP in the last 168 hours. D-Dimer Recent Labs    08/14/20 1145  DDIMER 1.41*   Hgb A1c No results for input(s): HGBA1C in the last 72 hours. Lipid Profile No results for input(s): CHOL, HDL, LDLCALC, TRIG, CHOLHDL, LDLDIRECT in the last 72 hours. Thyroid function studies No results for input(s): TSH, T4TOTAL, T3FREE, THYROIDAB in the last 72 hours.  Invalid input(s): FREET3 Anemia work up No results for input(s): VITAMINB12, FOLATE, FERRITIN, TIBC, IRON, RETICCTPCT in the last 72 hours. Urinalysis No results found for: COLORURINE, APPEARANCEUR, [place], [place], [place], [place], [place], [place], PROTEINUR, UROBILINOGEN, NITRITE, LEUKOCYTESUR Sepsis Labs Invalid input(s): PROCALCITONIN,  WBC,  LACTICIDVEN Microbiology Recent Results (from the past 240 hour(s))  Blood Culture (routine x 2)     Status: None (Preliminary  result)   Collection Time: 08/14/20  7:54 AM   Specimen: BLOOD  Result Value Ref Range Status   Specimen Description BLOOD RIGHT ANTECUBITAL  Final   Special Requests   Final    BOTTLES DRAWN AEROBIC AND ANAEROBIC Blood Culture results may not be optimal due to an excessive volume of blood received in culture bottles   Culture   Final    NO GROWTH < 24 HOURS Performed at [facility], [place] [address], [place], [place] [address]    Report Status PENDING  Incomplete  Blood Culture (routine x 2)     Status: None (Preliminary result)   Collection Time: 08/14/20  7:54 AM   Specimen: BLOOD LEFT FOREARM  Result Value Ref Range Status   Specimen Description BLOOD LEFT FOREARM  Final   Special Requests   Final    BOTTLES DRAWN AEROBIC AND ANAEROBIC Blood Culture results may not be optimal due to an excessive volume of blood received in culture bottles   Culture   Final    NO GROWTH < 24 HOURS Performed at [facility], [place] [address], [place], [place] [address]    Report Status PENDING  Incomplete  Respiratory Panel by RT PCR (Flu A&B, Covid) - Nasopharyngeal Swab     Status: None   Collection Time: 08/14/20  7:54 AM   Specimen: Nasopharyngeal Swab  Result Value Ref Range Status   SARS Coronavirus 2 by RT PCR NEGATIVE NEGATIVE Final    Comment: (NOTE) SARS-CoV-2 target nucleic acids are NOT DETECTED.  The SARS-CoV-2 RNA is generally detectable in upper respiratoy specimens during the acute phase of infection. The lowest concentration of SARS-CoV-2 viral copies this assay can detect is 131 copies/mL. A negative result does not preclude SARS-Cov-2 infection and should not be used as the sole basis for treatment or other patient management decisions. A negative result may occur with  improper specimen collection/handling, submission of specimen other than nasopharyngeal swab, presence of viral mutation(s) within the areas targeted by this assay, and inadequate number of viral  copies (<131 copies/mL). A negative result must be combined with clinical observations, patient history, and epidemiological information. The expected result is Negative.  Fact Sheet for Patients:  PinkCheek.be  Fact Sheet for Healthcare Providers:  GravelBags.it  This test is no t yet approved or cleared by the [place] FDA and  has been authorized for detection and/or diagnosis of SARS-CoV-2 by FDA under an Emergency Use Authorization (EUA). This EUA will  remain  in effect (meaning this test can be used) for the duration of the COVID-19 declaration under Section 564(b)(1) of the Act, 21 U.S.C. section 360bbb-3(b)(1), unless the authorization is terminated or revoked sooner.     Influenza A by PCR NEGATIVE NEGATIVE Final   Influenza B by PCR NEGATIVE NEGATIVE Final    Comment: (NOTE) The Xpert Xpress SARS-CoV-2/FLU/RSV assay is intended as an aid in  the diagnosis of influenza from Nasopharyngeal swab specimens and  should not be used as a sole basis for treatment. Nasal washings and  aspirates are unacceptable for Xpert Xpress SARS-CoV-2/FLU/RSV  testing.  Fact Sheet for Patients: PinkCheek.be  Fact Sheet for Healthcare Providers: GravelBags.it  This test is not yet approved or cleared by the [place] FDA and  has been authorized for detection and/or diagnosis of SARS-CoV-2 by  FDA under an Emergency Use Authorization (EUA). This EUA will remain  in effect (meaning this test can be used) for the duration of the  Covid-19 declaration under Section 564(b)(1) of the Act, 21  U.S.C. section 360bbb-3(b)(1), unless the authorization is  terminated or revoked. Performed at [facility], [place] [address] [place], [place] [address]   MRSA PCR Screening     Status: Abnormal   Collection Time: 08/14/20 11:47 AM  Result Value Ref Range Status   MRSA by  PCR POSITIVE (A) NEGATIVE Final    Comment:        The GeneXpert MRSA Assay (FDA approved for NASAL specimens only), is one component of a comprehensive MRSA colonization surveillance program. It is not intended to diagnose MRSA infection nor to guide or monitor treatment for MRSA infections. RESULT CALLED TO, READ BACK BY AND VERIFIED WITH: R,HARDY @1355  08/14/20 EB Performed at [facility], [place] [address] [place], [place] [address]      Time coordinating discharge: Over 30 minutes  SIGNED:   Charlynne Cousins, MD  [facility] 08/16/2020, 9:47 AM Pager   If 7PM-7AM, please contact night-coverage [email or web address] Password [email or web address]

## 2020-08-19 ENCOUNTER — Other Ambulatory Visit: Payer: Self-pay | Admitting: Geriatric Medicine

## 2020-08-19 ENCOUNTER — Ambulatory Visit
Admission: RE | Admit: 2020-08-19 | Discharge: 2020-08-19 | Disposition: A | Discharge status: Home / Self Care | Payer: Medicare HMO | Source: Ambulatory Visit | Attending: Geriatric Medicine | Admitting: Geriatric Medicine

## 2020-08-19 DIAGNOSIS — Z8546 Personal history of malignant neoplasm of prostate: Secondary | ICD-10-CM | POA: Diagnosis not present

## 2020-08-19 DIAGNOSIS — J189 Pneumonia, unspecified organism: Secondary | ICD-10-CM | POA: Diagnosis not present

## 2020-08-19 DIAGNOSIS — Z136 Encounter for screening for cardiovascular disorders: Secondary | ICD-10-CM | POA: Diagnosis not present

## 2020-08-19 DIAGNOSIS — Z79899 Other long term (current) drug therapy: Secondary | ICD-10-CM | POA: Diagnosis not present

## 2020-08-19 DIAGNOSIS — Z1389 Encounter for screening for other disorder: Secondary | ICD-10-CM | POA: Diagnosis not present

## 2020-08-19 DIAGNOSIS — J181 Lobar pneumonia, unspecified organism: Secondary | ICD-10-CM | POA: Diagnosis not present

## 2020-08-19 DIAGNOSIS — I872 Venous insufficiency (chronic) (peripheral): Secondary | ICD-10-CM | POA: Diagnosis not present

## 2020-08-19 DIAGNOSIS — Z0000 Encounter for general adult medical examination without abnormal findings: Secondary | ICD-10-CM | POA: Diagnosis not present

## 2020-08-19 DIAGNOSIS — Z1159 Encounter for screening for other viral diseases: Secondary | ICD-10-CM | POA: Diagnosis not present

## 2020-08-19 LAB — CULTURE, BLOOD (ROUTINE X 2)
Culture: NO GROWTH
Culture: NO GROWTH

## 2020-08-20 DIAGNOSIS — Z20822 Contact with and (suspected) exposure to covid-19: Secondary | ICD-10-CM | POA: Diagnosis not present

## 2020-09-14 ENCOUNTER — Ambulatory Visit
Admission: RE | Admit: 2020-09-14 | Discharge: 2020-09-14 | Disposition: A | Discharge status: Home / Self Care | Payer: Medicare HMO | Source: Ambulatory Visit | Attending: Geriatric Medicine | Admitting: Geriatric Medicine

## 2020-09-14 ENCOUNTER — Other Ambulatory Visit: Payer: Self-pay | Admitting: Geriatric Medicine

## 2020-09-14 DIAGNOSIS — J189 Pneumonia, unspecified organism: Secondary | ICD-10-CM

## 2020-09-14 DIAGNOSIS — Z8701 Personal history of pneumonia (recurrent): Secondary | ICD-10-CM | POA: Diagnosis not present

## 2020-09-15 ENCOUNTER — Other Ambulatory Visit: Payer: Self-pay | Admitting: Geriatric Medicine

## 2020-09-15 DIAGNOSIS — J189 Pneumonia, unspecified organism: Secondary | ICD-10-CM

## 2020-10-05 ENCOUNTER — Other Ambulatory Visit: Payer: Self-pay

## 2020-10-05 ENCOUNTER — Ambulatory Visit
Admission: RE | Admit: 2020-10-05 | Discharge: 2020-10-05 | Disposition: A | Discharge status: Home / Self Care | Payer: Medicare HMO | Source: Ambulatory Visit | Attending: Geriatric Medicine | Admitting: Geriatric Medicine

## 2020-10-05 DIAGNOSIS — R69 Illness, unspecified: Secondary | ICD-10-CM | POA: Diagnosis not present

## 2020-10-05 DIAGNOSIS — I2510 Atherosclerotic heart disease of native coronary artery without angina pectoris: Secondary | ICD-10-CM | POA: Diagnosis not present

## 2020-10-05 DIAGNOSIS — I708 Atherosclerosis of other arteries: Secondary | ICD-10-CM | POA: Diagnosis not present

## 2020-10-05 DIAGNOSIS — J189 Pneumonia, unspecified organism: Secondary | ICD-10-CM

## 2020-10-05 DIAGNOSIS — Z8546 Personal history of malignant neoplasm of prostate: Secondary | ICD-10-CM | POA: Diagnosis not present

## 2020-10-05 DIAGNOSIS — J181 Lobar pneumonia, unspecified organism: Secondary | ICD-10-CM | POA: Diagnosis not present

## 2020-10-05 MED ORDER — IOPAMIDOL (ISOVUE-300) INJECTION 61%
75.0000 mL | Freq: Once | INTRAVENOUS | Status: AC | PRN
  Administered 2020-10-05: 75 mL via INTRAVENOUS

## 2020-10-29 DIAGNOSIS — Z20822 Contact with and (suspected) exposure to covid-19: Secondary | ICD-10-CM | POA: Diagnosis not present

## 2021-01-06 DIAGNOSIS — Z20822 Contact with and (suspected) exposure to covid-19: Secondary | ICD-10-CM | POA: Diagnosis not present

## 2021-01-08 DIAGNOSIS — N5231 Erectile dysfunction following radical prostatectomy: Secondary | ICD-10-CM | POA: Diagnosis not present

## 2021-01-08 DIAGNOSIS — N393 Stress incontinence (female) (male): Secondary | ICD-10-CM | POA: Diagnosis not present

## 2021-01-08 DIAGNOSIS — C61 Malignant neoplasm of prostate: Secondary | ICD-10-CM | POA: Diagnosis not present

## 2021-03-31 DIAGNOSIS — N393 Stress incontinence (female) (male): Secondary | ICD-10-CM | POA: Diagnosis not present

## 2021-03-31 DIAGNOSIS — Z8546 Personal history of malignant neoplasm of prostate: Secondary | ICD-10-CM | POA: Diagnosis not present

## 2021-04-14 DIAGNOSIS — N390 Urinary tract infection, site not specified: Secondary | ICD-10-CM | POA: Diagnosis not present

## 2021-04-14 DIAGNOSIS — E7800 Pure hypercholesterolemia, unspecified: Secondary | ICD-10-CM | POA: Diagnosis not present

## 2021-04-14 DIAGNOSIS — Z79899 Other long term (current) drug therapy: Secondary | ICD-10-CM | POA: Diagnosis not present

## 2021-04-21 DIAGNOSIS — Z6834 Body mass index (BMI) 34.0-34.9, adult: Secondary | ICD-10-CM | POA: Diagnosis not present

## 2021-04-21 DIAGNOSIS — E669 Obesity, unspecified: Secondary | ICD-10-CM | POA: Diagnosis not present

## 2021-04-21 DIAGNOSIS — Z01818 Encounter for other preprocedural examination: Secondary | ICD-10-CM | POA: Diagnosis not present

## 2021-04-21 DIAGNOSIS — N5231 Erectile dysfunction following radical prostatectomy: Secondary | ICD-10-CM | POA: Diagnosis not present

## 2021-04-21 DIAGNOSIS — I872 Venous insufficiency (chronic) (peripheral): Secondary | ICD-10-CM | POA: Diagnosis not present

## 2021-04-21 DIAGNOSIS — N393 Stress incontinence (female) (male): Secondary | ICD-10-CM | POA: Diagnosis not present

## 2021-04-21 DIAGNOSIS — K4090 Unilateral inguinal hernia, without obstruction or gangrene, not specified as recurrent: Secondary | ICD-10-CM | POA: Diagnosis not present

## 2021-04-30 DIAGNOSIS — N5231 Erectile dysfunction following radical prostatectomy: Secondary | ICD-10-CM | POA: Diagnosis not present

## 2021-04-30 DIAGNOSIS — Z9079 Acquired absence of other genital organ(s): Secondary | ICD-10-CM | POA: Diagnosis not present

## 2021-04-30 DIAGNOSIS — M1990 Unspecified osteoarthritis, unspecified site: Secondary | ICD-10-CM | POA: Diagnosis not present

## 2021-04-30 DIAGNOSIS — R54 Age-related physical debility: Secondary | ICD-10-CM | POA: Diagnosis not present

## 2021-04-30 DIAGNOSIS — Z8616 Personal history of COVID-19: Secondary | ICD-10-CM | POA: Diagnosis not present

## 2021-04-30 DIAGNOSIS — K4090 Unilateral inguinal hernia, without obstruction or gangrene, not specified as recurrent: Secondary | ICD-10-CM | POA: Diagnosis not present

## 2021-04-30 DIAGNOSIS — Z86718 Personal history of other venous thrombosis and embolism: Secondary | ICD-10-CM | POA: Diagnosis not present

## 2021-04-30 DIAGNOSIS — I872 Venous insufficiency (chronic) (peripheral): Secondary | ICD-10-CM | POA: Diagnosis not present

## 2021-04-30 DIAGNOSIS — N393 Stress incontinence (female) (male): Secondary | ICD-10-CM | POA: Diagnosis not present

## 2021-04-30 DIAGNOSIS — E669 Obesity, unspecified: Secondary | ICD-10-CM | POA: Diagnosis not present

## 2021-05-04 DIAGNOSIS — L570 Actinic keratosis: Secondary | ICD-10-CM | POA: Diagnosis not present

## 2021-05-04 DIAGNOSIS — L821 Other seborrheic keratosis: Secondary | ICD-10-CM | POA: Diagnosis not present

## 2021-05-04 DIAGNOSIS — B353 Tinea pedis: Secondary | ICD-10-CM | POA: Diagnosis not present

## 2021-06-09 DIAGNOSIS — N393 Stress incontinence (female) (male): Secondary | ICD-10-CM | POA: Diagnosis not present

## 2021-07-20 DIAGNOSIS — Z0000 Encounter for general adult medical examination without abnormal findings: Secondary | ICD-10-CM | POA: Diagnosis not present

## 2021-07-20 DIAGNOSIS — I700 Atherosclerosis of aorta: Secondary | ICD-10-CM | POA: Diagnosis not present

## 2021-07-20 DIAGNOSIS — I872 Venous insufficiency (chronic) (peripheral): Secondary | ICD-10-CM | POA: Diagnosis not present

## 2021-07-20 DIAGNOSIS — E7800 Pure hypercholesterolemia, unspecified: Secondary | ICD-10-CM | POA: Diagnosis not present

## 2021-07-20 DIAGNOSIS — Z79899 Other long term (current) drug therapy: Secondary | ICD-10-CM | POA: Diagnosis not present

## 2021-07-20 DIAGNOSIS — Z8546 Personal history of malignant neoplasm of prostate: Secondary | ICD-10-CM | POA: Diagnosis not present

## 2021-07-20 DIAGNOSIS — R011 Cardiac murmur, unspecified: Secondary | ICD-10-CM | POA: Diagnosis not present

## 2021-07-23 DIAGNOSIS — R011 Cardiac murmur, unspecified: Secondary | ICD-10-CM | POA: Diagnosis not present

## 2021-07-30 DIAGNOSIS — H5213 Myopia, bilateral: Secondary | ICD-10-CM | POA: Diagnosis not present

## 2021-07-30 DIAGNOSIS — Z0100 Encounter for examination of eyes and vision without abnormal findings: Secondary | ICD-10-CM | POA: Diagnosis not present

## 2021-09-21 DIAGNOSIS — N393 Stress incontinence (female) (male): Secondary | ICD-10-CM | POA: Diagnosis not present

## 2022-01-13 DIAGNOSIS — N5231 Erectile dysfunction following radical prostatectomy: Secondary | ICD-10-CM | POA: Diagnosis not present

## 2022-01-13 DIAGNOSIS — N393 Stress incontinence (female) (male): Secondary | ICD-10-CM | POA: Diagnosis not present

## 2022-01-13 DIAGNOSIS — R339 Retention of urine, unspecified: Secondary | ICD-10-CM | POA: Diagnosis not present

## 2022-01-13 DIAGNOSIS — C61 Malignant neoplasm of prostate: Secondary | ICD-10-CM | POA: Diagnosis not present

## 2022-03-15 DIAGNOSIS — Z0100 Encounter for examination of eyes and vision without abnormal findings: Secondary | ICD-10-CM | POA: Diagnosis not present

## 2022-04-19 DIAGNOSIS — D1801 Hemangioma of skin and subcutaneous tissue: Secondary | ICD-10-CM | POA: Diagnosis not present

## 2022-04-19 DIAGNOSIS — B078 Other viral warts: Secondary | ICD-10-CM | POA: Diagnosis not present

## 2022-04-19 DIAGNOSIS — L821 Other seborrheic keratosis: Secondary | ICD-10-CM | POA: Diagnosis not present

## 2022-04-19 DIAGNOSIS — D1723 Benign lipomatous neoplasm of skin and subcutaneous tissue of right leg: Secondary | ICD-10-CM | POA: Diagnosis not present

## 2022-04-19 DIAGNOSIS — D1721 Benign lipomatous neoplasm of skin and subcutaneous tissue of right arm: Secondary | ICD-10-CM | POA: Diagnosis not present

## 2022-04-19 DIAGNOSIS — L570 Actinic keratosis: Secondary | ICD-10-CM | POA: Diagnosis not present

## 2022-06-22 DIAGNOSIS — N5231 Erectile dysfunction following radical prostatectomy: Secondary | ICD-10-CM | POA: Diagnosis not present

## 2022-06-22 DIAGNOSIS — Z466 Encounter for fitting and adjustment of urinary device: Secondary | ICD-10-CM | POA: Diagnosis not present

## 2022-06-22 DIAGNOSIS — N393 Stress incontinence (female) (male): Secondary | ICD-10-CM | POA: Diagnosis not present

## 2022-07-26 DIAGNOSIS — Z8546 Personal history of malignant neoplasm of prostate: Secondary | ICD-10-CM | POA: Diagnosis not present

## 2022-07-26 DIAGNOSIS — Z79899 Other long term (current) drug therapy: Secondary | ICD-10-CM | POA: Diagnosis not present

## 2022-07-26 DIAGNOSIS — E7800 Pure hypercholesterolemia, unspecified: Secondary | ICD-10-CM | POA: Diagnosis not present

## 2022-07-26 DIAGNOSIS — Z1331 Encounter for screening for depression: Secondary | ICD-10-CM | POA: Diagnosis not present

## 2022-07-26 DIAGNOSIS — Z0000 Encounter for general adult medical examination without abnormal findings: Secondary | ICD-10-CM | POA: Diagnosis not present

## 2022-07-26 DIAGNOSIS — I700 Atherosclerosis of aorta: Secondary | ICD-10-CM | POA: Diagnosis not present

## 2022-07-26 DIAGNOSIS — I872 Venous insufficiency (chronic) (peripheral): Secondary | ICD-10-CM | POA: Diagnosis not present

## 2022-08-07 ENCOUNTER — Other Ambulatory Visit: Payer: Self-pay

## 2022-08-07 ENCOUNTER — Encounter (HOSPITAL_BASED_OUTPATIENT_CLINIC_OR_DEPARTMENT_OTHER): Payer: Self-pay | Admitting: Emergency Medicine

## 2022-08-07 ENCOUNTER — Emergency Department (HOSPITAL_BASED_OUTPATIENT_CLINIC_OR_DEPARTMENT_OTHER): Payer: Medicare HMO

## 2022-08-07 ENCOUNTER — Emergency Department (HOSPITAL_BASED_OUTPATIENT_CLINIC_OR_DEPARTMENT_OTHER)
Admission: EM | Admit: 2022-08-07 | Discharge: 2022-08-07 | Disposition: A | Discharge status: Home / Self Care | Payer: Medicare HMO | Attending: Emergency Medicine | Admitting: Emergency Medicine

## 2022-08-07 ENCOUNTER — Emergency Department (HOSPITAL_BASED_OUTPATIENT_CLINIC_OR_DEPARTMENT_OTHER): Payer: Medicare HMO | Admitting: Radiology

## 2022-08-07 DIAGNOSIS — B9789 Other viral agents as the cause of diseases classified elsewhere: Secondary | ICD-10-CM | POA: Diagnosis not present

## 2022-08-07 DIAGNOSIS — R059 Cough, unspecified: Secondary | ICD-10-CM | POA: Diagnosis not present

## 2022-08-07 DIAGNOSIS — Z20822 Contact with and (suspected) exposure to covid-19: Secondary | ICD-10-CM | POA: Insufficient documentation

## 2022-08-07 DIAGNOSIS — R062 Wheezing: Secondary | ICD-10-CM | POA: Insufficient documentation

## 2022-08-07 DIAGNOSIS — J069 Acute upper respiratory infection, unspecified: Secondary | ICD-10-CM | POA: Diagnosis not present

## 2022-08-07 DIAGNOSIS — Z7901 Long term (current) use of anticoagulants: Secondary | ICD-10-CM | POA: Diagnosis not present

## 2022-08-07 DIAGNOSIS — Z0389 Encounter for observation for other suspected diseases and conditions ruled out: Secondary | ICD-10-CM | POA: Diagnosis not present

## 2022-08-07 HISTORY — DX: Deep phlebothrombosis in pregnancy, unspecified trimester: O22.30

## 2022-08-07 LAB — BASIC METABOLIC PANEL
Anion gap: 10 (ref 5–15)
BUN: 12 mg/dL (ref 8–23)
CO2: 26 mmol/L (ref 22–32)
Calcium: 9.3 mg/dL (ref 8.9–10.3)
Chloride: 104 mmol/L (ref 98–111)
Creatinine, Ser: 1.2 mg/dL (ref 0.61–1.24)
GFR, Estimated: >60 mL/min (ref >60)
Glucose, Bld: 87 mg/dL (ref 70–99)
Potassium: 3.9 mmol/L (ref 3.5–5.1)
Sodium: 140 mmol/L (ref 135–145)

## 2022-08-07 LAB — BRAIN NATRIURETIC PEPTIDE: B Natriuretic Peptide: 31.8 pg/mL (ref 0.0–100.0)

## 2022-08-07 LAB — CBC WITH DIFFERENTIAL/PLATELET
Abs Immature Granulocytes: 0.02 10*3/uL (ref 0.00–0.07)
Basophils Absolute: 0.1 10*3/uL (ref 0.0–0.1)
Basophils Relative: 1 %
Eosinophils Absolute: 0.2 10*3/uL (ref 0.0–0.5)
Eosinophils Relative: 4 %
HCT: 39.5 % (ref 39.0–52.0)
Hemoglobin: 12.4 g/dL — ABNORMAL LOW (ref 13.0–17.0)
Immature Granulocytes: 0 %
Lymphocytes Relative: 21 %
Lymphs Abs: 1.2 10*3/uL (ref 0.7–4.0)
MCH: 25.8 pg — ABNORMAL LOW (ref 26.0–34.0)
MCHC: 31.4 g/dL (ref 30.0–36.0)
MCV: 82.1 fL (ref 80.0–100.0)
Monocytes Absolute: 0.8 10*3/uL (ref 0.1–1.0)
Monocytes Relative: 14 %
Neutro Abs: 3.4 10*3/uL (ref 1.7–7.7)
Neutrophils Relative %: 60 %
Platelets: 180 10*3/uL (ref 150–400)
RBC: 4.81 MIL/uL (ref 4.22–5.81)
RDW: 15.7 % — ABNORMAL HIGH (ref 11.5–15.5)
WBC: 5.6 10*3/uL (ref 4.0–10.5)
nRBC: 0 % (ref 0.0–0.2)

## 2022-08-07 LAB — RESP PANEL BY RT-PCR (FLU A&B, COVID) ARPGX2
Influenza A by PCR: NEGATIVE
Influenza B by PCR: NEGATIVE
SARS Coronavirus 2 by RT PCR: NEGATIVE

## 2022-08-07 LAB — TROPONIN I (HIGH SENSITIVITY): Troponin I (High Sensitivity): 4 ng/L (ref <18)

## 2022-08-07 MED ORDER — IPRATROPIUM-ALBUTEROL 0.5-2.5 (3) MG/3ML IN SOLN: 3.0000 mL | Freq: Once | RESPIRATORY_TRACT | Status: AC

## 2022-08-07 MED ORDER — IOHEXOL 350 MG/ML SOLN
100.0000 mL | Freq: Once | INTRAVENOUS | Status: AC | PRN
  Administered 2022-08-07: 75 mL via INTRAVENOUS

## 2022-08-07 MED ORDER — IPRATROPIUM-ALBUTEROL 0.5-2.5 (3) MG/3ML IN SOLN
RESPIRATORY_TRACT | Status: AC
  Administered 2022-08-07: 3 mL via RESPIRATORY_TRACT
  Filled 2022-08-07: qty 3

## 2022-08-07 NOTE — ED Provider Notes (Signed)
  [facility] EMERGENCY DEPT Provider Note   CSN: [id number] Arrival date & time: 08/07/22  1032     History {Add pertinent medical, surgical, social history, OB history to HPI:1} Chief Complaint  Patient presents with   URI    MICHALL NOFFKE is a 71 y.o. male.   URI      Home Medications Prior to Admission medications   Medication Sig Start Date End Date Taking? Authorizing Provider  dextromethorphan-guaiFENesin (MUCINEX DM) 30-600 MG 12hr tablet Take 1 tablet by mouth 2 (two) times daily. 08/16/20   Charlynne Cousins, MD  Multiple Vitamin (MULTIVITAMIN) tablet Take 1 tablet by mouth daily.    [provider]      Allergies    Patient has no known allergies.    Review of Systems   Review of Systems  Physical Exam Updated Vital Signs BP (!) 152/90 (BP Location: Right Arm)   Pulse 64   Temp 97.9 F (36.6 C)   Resp 16   SpO2 99%  Physical Exam  ED Results / Procedures / Treatments   Labs (all labs ordered are listed, but only abnormal results are displayed) Labs Reviewed - No data to display  EKG None  Radiology DG Chest 2 View  Result Date: 08/07/2022 CLINICAL DATA:  Cough and wheezing.  History of pneumonia. EXAM: CHEST - 2 VIEW COMPARISON:  09/14/2020. FINDINGS: Cardiac silhouette is normal in size and configuration. Normal mediastinal and hilar contours. Clear lungs.  No pleural effusion or pneumothorax. Skeletal structures are intact. IMPRESSION: No active cardiopulmonary disease. Electronically Signed   By: Lajean Manes M.D.   On: 08/07/2022 11:24    Procedures Procedures  {Document cardiac monitor, telemetry assessment procedure when appropriate:1}  Medications Ordered in ED Medications - No data to display  ED Course/ Medical Decision Making/ A&P                           Medical Decision Making Amount and/or Complexity of Data Reviewed Radiology: ordered.   ***  {Document critical care time when  appropriate:1} {Document review of labs and clinical decision tools ie heart score, Chads2Vasc2 etc:1}  {Document your independent review of radiology images, and any outside records:1} {Document your discussion with family members, caretakers, and with consultants:1} {Document social determinants of health affecting pt's care:1} {Document your decision making why or why not admission, treatments were needed:1} Final Clinical Impression(s) / ED Diagnoses Final diagnoses:  None    Rx / DC Orders ED Discharge Orders     None

## 2022-08-07 NOTE — Discharge Instructions (Addendum)
Your CXR and CTA were negative for acute findings. Symptoms are consistent with a likely viral URI  If you develop high fever, coughing up blood, trouble breathing or any other new/concerning symptoms then return to the ER.

## 2022-08-07 NOTE — ED Triage Notes (Signed)
Pt worried about pneumonia. Pt is retired Software engineer

## 2022-08-07 NOTE — ED Triage Notes (Signed)
Pt started with runny nose, progressed to cough in past 2 days, subjective fevers, feels like he has a wheeze on inspiration .nonproductive cough

## 2022-08-07 NOTE — ED Provider Notes (Signed)
Patient's CTA is negative for PE or pneuomnia. Likely has viral URI. Is otherwise well appearing. Continue supportive care at home. D/c home with return precautions.    Sherwood Gambler, MD 08/07/22 [phone]

## 2022-08-12 ENCOUNTER — Telehealth: Payer: Self-pay

## 2022-08-12 NOTE — Telephone Encounter (Signed)
   Reason for call: ED-Follow up Visit  Patient  visit on 08/07/2022  at [facility] was for URI  Have you been able to follow up with your primary care physician? - Left a voicemail to schedule as patient will be out of town for a week  The patient was or was not able to obtain any needed medicine or equipment. - Yes  Are there diet recommendations that you are having difficulty following? - No  Patient expresses understanding of discharge instructions and education provided has no other needs at this time.    [facility] management  [place], [place]  Main Phone: [phone]  E-mail: [email or web address]  Website: [email or web address]

## 2022-09-18 IMAGING — CT CT CHEST W/ CM
1 series · 15 of 34 positions shown, 19 images · IV contrast (APPLIED)
Comparison: None.

CLINICAL DATA: Follow-up LEFT lower pneumonia. History of prostate
cancer. Nonsmoker.

EXAM:
CT CHEST WITH CONTRAST
TECHNIQUE: Multidetector CT imaging of the chest was performed during
intravenous contrast administration.
CONTRAST:  75mL K23F0Z-LDD IOPAMIDOL (K23F0Z-LDD) INJECTION 61%

[Series 2: chest w/cm · axial · 0.83mm/px · z∈[-351,-59]mm · 15 of 172 slices shown, 19 images]
[im 13/172  mediastinal]
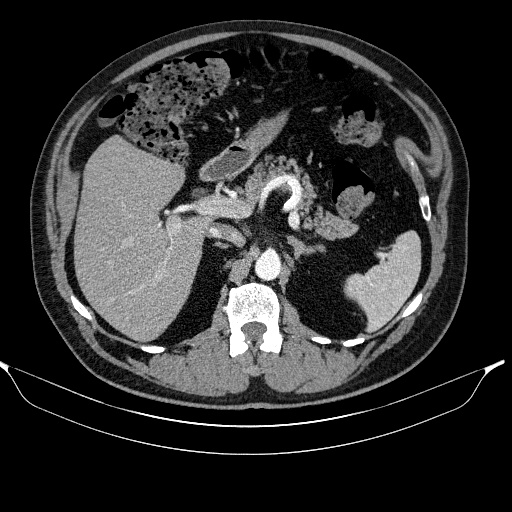
[im 13/172  lung]
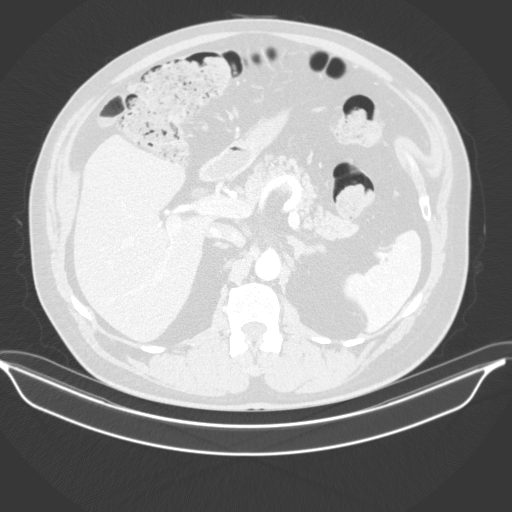
[im 26/172  lung]
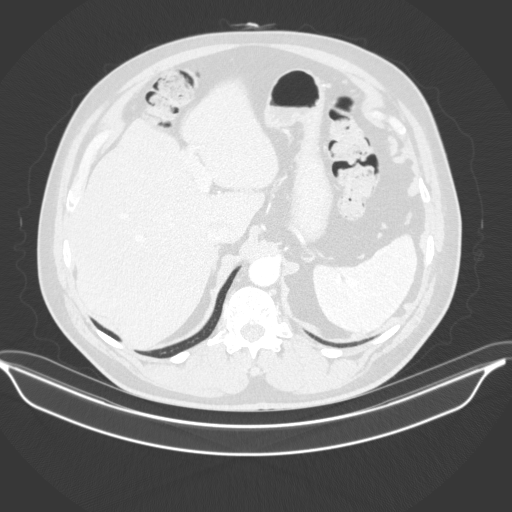
[im 35/172  lung]
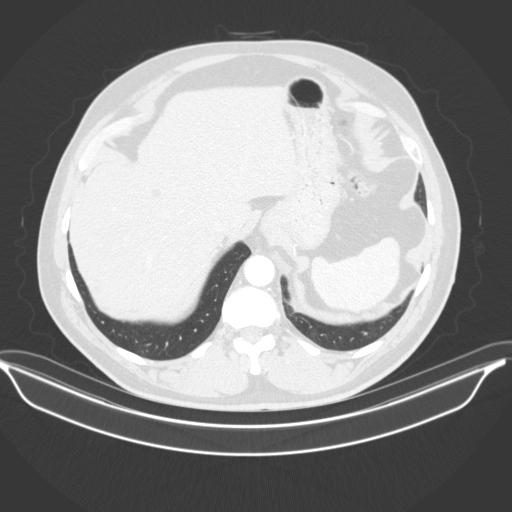
[im 45/172  lung]
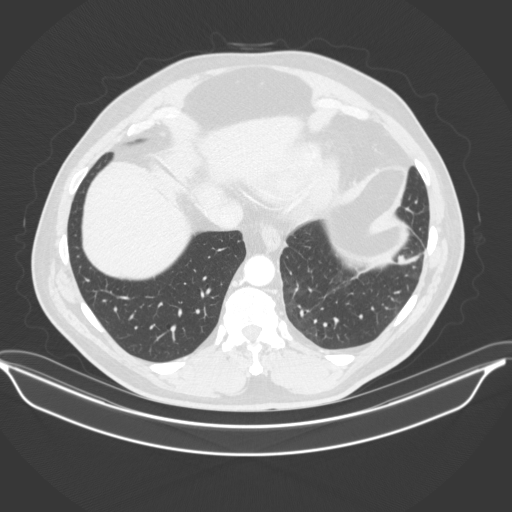
[im 58/172  mediastinal]
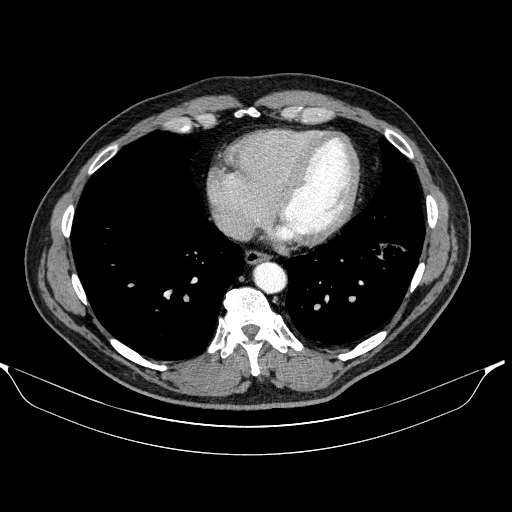
[im 58/172  lung]
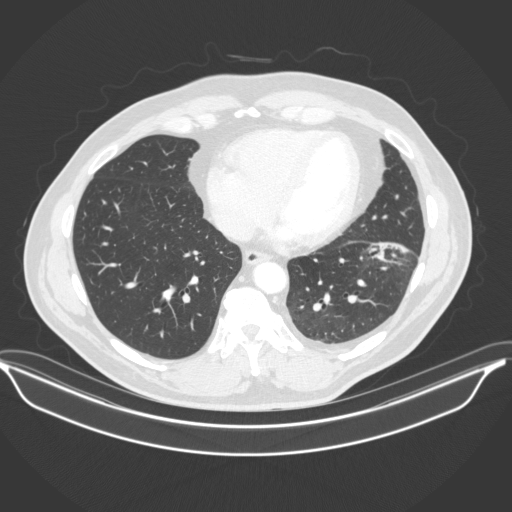
[im 69/172  lung]
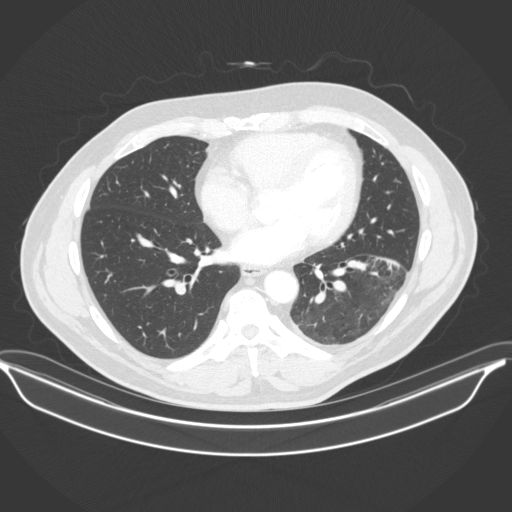
[im 77/172  lung]
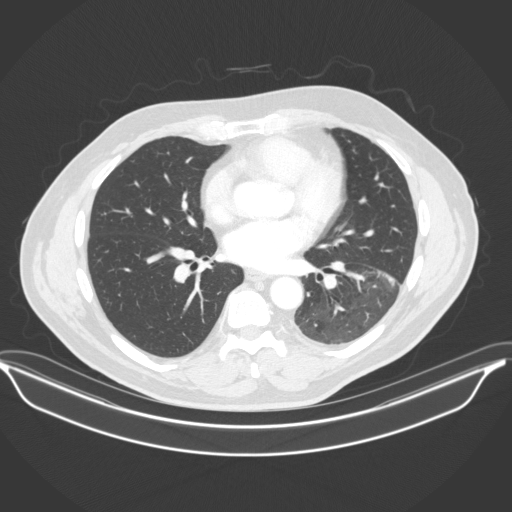
[im 89/172  lung]
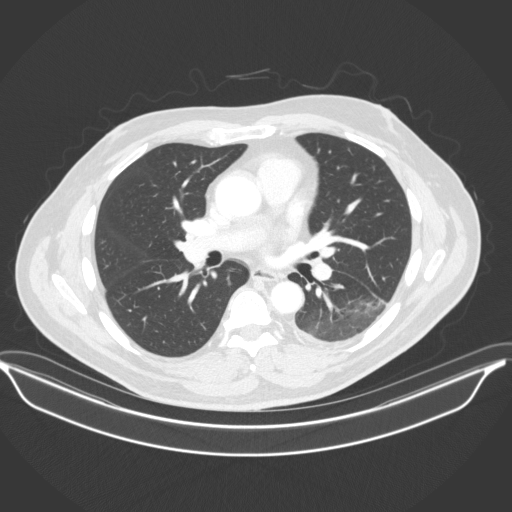
[im 96/172  mediastinal]
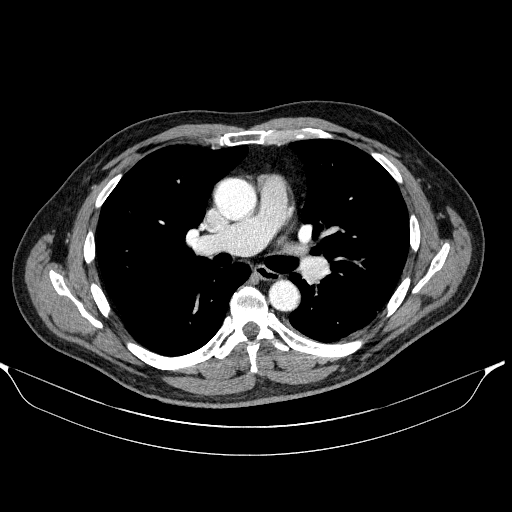
[im 96/172  lung]
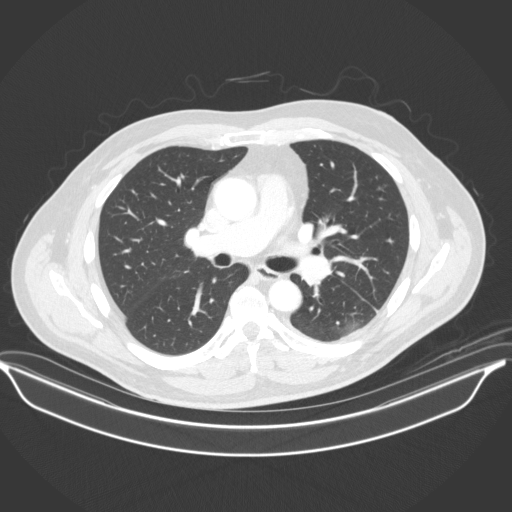
[im 103/172  lung]
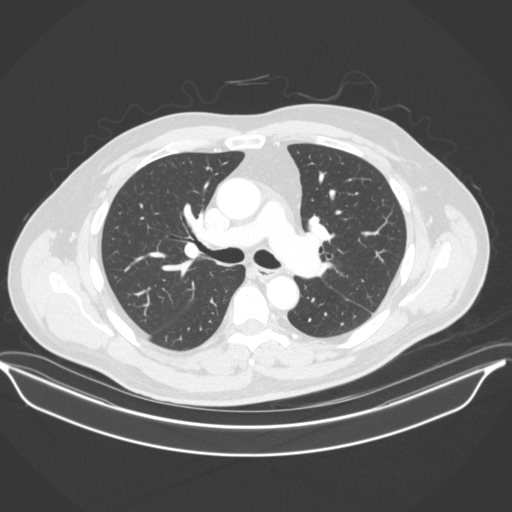
[im 115/172  lung]
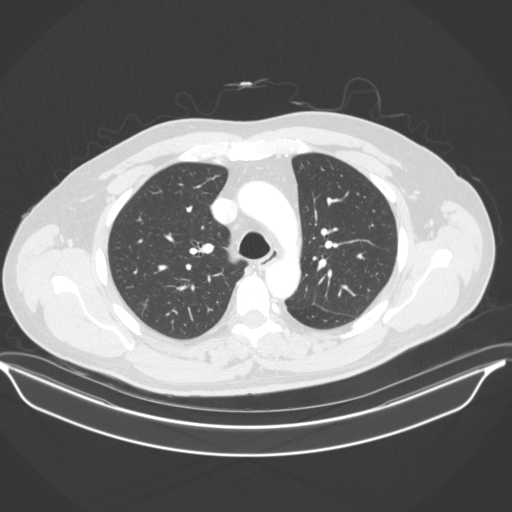
[im 127/172  lung]
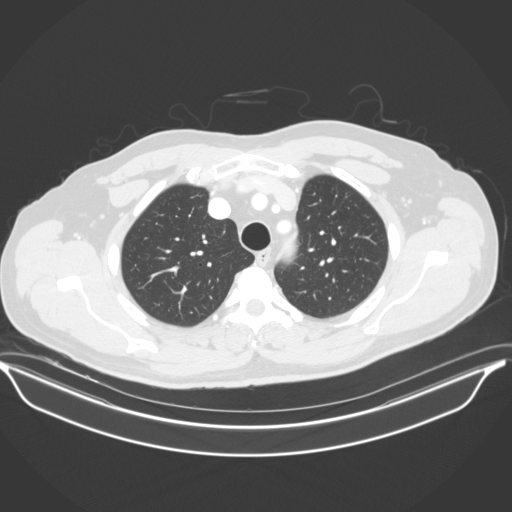
[im 137/172  mediastinal]
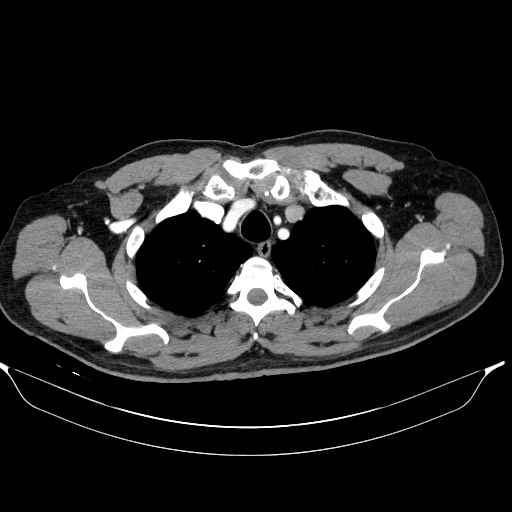
[im 137/172  lung]
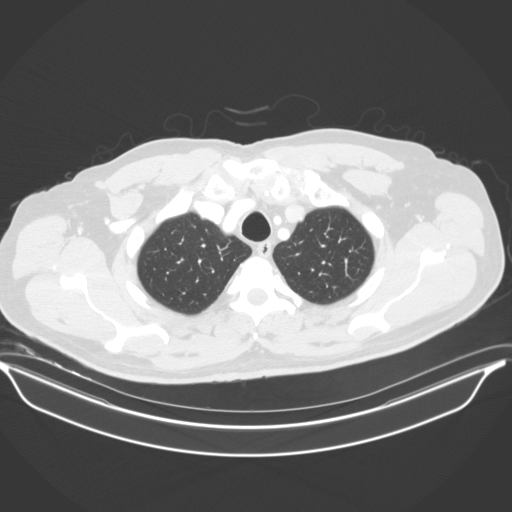
[im 146/172  lung]
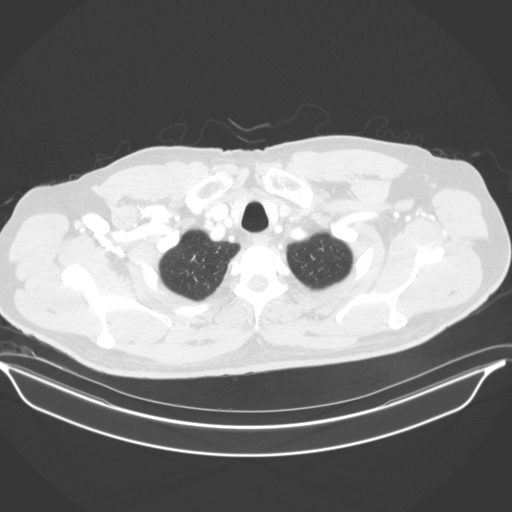
[im 159/172  lung]
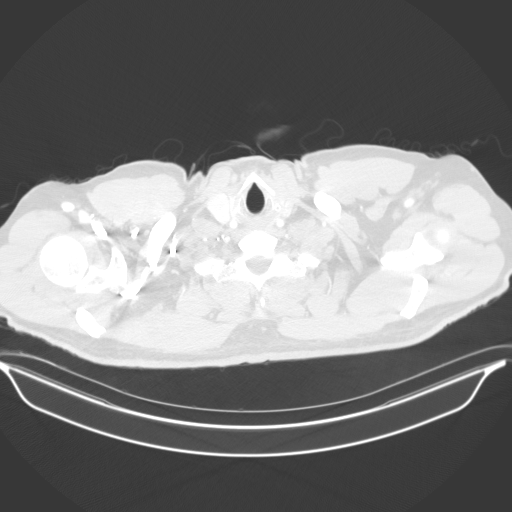

[15 of 34 positions shown; findings below may reference images not displayed]

FINDINGS: Cardiovascular: Coronary artery calcification and aortic
atherosclerotic calcification.

Mediastinum/Nodes: No axillary or supraclavicular adenopathy. No
mediastinal or hilar adenopathy. No pericardial fluid. Esophagus
normal.

Lungs/Pleura: There is mild residual ground-glass density and
moderate pleural thickening in the superior segment of the LEFT
lower lobe (image 91/series 5). Pleural thickening and mild
bronchiectasis extends into the LEFT lower lobe inferiorly (image
119/5) along the fissure. No suspicious nodularity.

Upper Abdomen: Limited view of the liver, kidneys, pancreas are
unremarkable. Normal adrenal glands.

Musculoskeletal: No aggressive osseous lesion.
IMPRESSION: 1. Resolving LEFT lower lobe pneumonia. Mild ground-glass density
and pleural thickening remain. No suspicious nodularity.
2. No mediastinal adenopathy.
3. Coronary artery calcification and Aortic Atherosclerosis
([id number]).

## 2022-10-26 ENCOUNTER — Other Ambulatory Visit: Payer: Self-pay | Admitting: Internal Medicine

## 2022-10-26 ENCOUNTER — Ambulatory Visit
Admission: RE | Admit: 2022-10-26 | Discharge: 2022-10-26 | Disposition: A | Discharge status: Home / Self Care | Payer: Medicare HMO | Source: Ambulatory Visit | Attending: Internal Medicine | Admitting: Internal Medicine

## 2022-10-26 DIAGNOSIS — M47816 Spondylosis without myelopathy or radiculopathy, lumbar region: Secondary | ICD-10-CM | POA: Diagnosis not present

## 2022-10-26 DIAGNOSIS — M5441 Lumbago with sciatica, right side: Secondary | ICD-10-CM

## 2022-10-26 DIAGNOSIS — M25551 Pain in right hip: Secondary | ICD-10-CM

## 2022-10-26 DIAGNOSIS — R7989 Other specified abnormal findings of blood chemistry: Secondary | ICD-10-CM | POA: Diagnosis not present

## 2022-10-26 DIAGNOSIS — M545 Low back pain, unspecified: Secondary | ICD-10-CM | POA: Diagnosis not present

## 2022-10-26 DIAGNOSIS — D649 Anemia, unspecified: Secondary | ICD-10-CM | POA: Diagnosis not present

## 2022-11-02 DIAGNOSIS — M25551 Pain in right hip: Secondary | ICD-10-CM | POA: Diagnosis not present

## 2022-11-11 DIAGNOSIS — M5441 Lumbago with sciatica, right side: Secondary | ICD-10-CM | POA: Diagnosis not present

## 2022-11-17 DIAGNOSIS — M5441 Lumbago with sciatica, right side: Secondary | ICD-10-CM | POA: Diagnosis not present

## 2022-11-24 DIAGNOSIS — M5441 Lumbago with sciatica, right side: Secondary | ICD-10-CM | POA: Diagnosis not present

## 2022-12-08 DIAGNOSIS — M5441 Lumbago with sciatica, right side: Secondary | ICD-10-CM | POA: Diagnosis not present

## 2022-12-14 DIAGNOSIS — M25552 Pain in left hip: Secondary | ICD-10-CM | POA: Diagnosis not present

## 2022-12-16 DIAGNOSIS — M545 Low back pain, unspecified: Secondary | ICD-10-CM | POA: Diagnosis not present

## 2022-12-19 DIAGNOSIS — M545 Low back pain, unspecified: Secondary | ICD-10-CM | POA: Diagnosis not present

## 2022-12-28 DIAGNOSIS — M5441 Lumbago with sciatica, right side: Secondary | ICD-10-CM | POA: Diagnosis not present

## 2023-01-30 DIAGNOSIS — M5136 Other intervertebral disc degeneration, lumbar region: Secondary | ICD-10-CM | POA: Diagnosis not present

## 2023-01-30 DIAGNOSIS — M5416 Radiculopathy, lumbar region: Secondary | ICD-10-CM | POA: Diagnosis not present

## 2023-01-30 DIAGNOSIS — Z6833 Body mass index (BMI) 33.0-33.9, adult: Secondary | ICD-10-CM | POA: Diagnosis not present

## 2023-02-22 DIAGNOSIS — M5416 Radiculopathy, lumbar region: Secondary | ICD-10-CM | POA: Diagnosis not present

## 2023-03-13 DIAGNOSIS — M5416 Radiculopathy, lumbar region: Secondary | ICD-10-CM | POA: Diagnosis not present

## 2023-03-16 DIAGNOSIS — Z0100 Encounter for examination of eyes and vision without abnormal findings: Secondary | ICD-10-CM | POA: Diagnosis not present

## 2023-03-16 DIAGNOSIS — H5213 Myopia, bilateral: Secondary | ICD-10-CM | POA: Diagnosis not present

## 2023-04-20 DIAGNOSIS — L570 Actinic keratosis: Secondary | ICD-10-CM | POA: Diagnosis not present

## 2023-04-20 DIAGNOSIS — D2261 Melanocytic nevi of right upper limb, including shoulder: Secondary | ICD-10-CM | POA: Diagnosis not present

## 2023-04-20 DIAGNOSIS — L821 Other seborrheic keratosis: Secondary | ICD-10-CM | POA: Diagnosis not present

## 2023-04-28 ENCOUNTER — Encounter (HOSPITAL_COMMUNITY): Payer: Self-pay

## 2023-04-28 ENCOUNTER — Emergency Department (HOSPITAL_COMMUNITY)
Admission: EM | Admit: 2023-04-28 | Discharge: 2023-04-28 | Disposition: A | Discharge status: Home / Self Care | Payer: Medicare HMO | Attending: Emergency Medicine | Admitting: Emergency Medicine

## 2023-04-28 ENCOUNTER — Other Ambulatory Visit: Payer: Self-pay

## 2023-04-28 DIAGNOSIS — M5442 Lumbago with sciatica, left side: Secondary | ICD-10-CM | POA: Diagnosis not present

## 2023-04-28 DIAGNOSIS — Y92007 Garden or yard of unspecified non-institutional (private) residence as the place of occurrence of the external cause: Secondary | ICD-10-CM | POA: Diagnosis not present

## 2023-04-28 DIAGNOSIS — Z8546 Personal history of malignant neoplasm of prostate: Secondary | ICD-10-CM | POA: Diagnosis not present

## 2023-04-28 DIAGNOSIS — S3992XA Unspecified injury of lower back, initial encounter: Secondary | ICD-10-CM | POA: Diagnosis not present

## 2023-04-28 DIAGNOSIS — G8929 Other chronic pain: Secondary | ICD-10-CM

## 2023-04-28 DIAGNOSIS — M5431 Sciatica, right side: Secondary | ICD-10-CM | POA: Diagnosis not present

## 2023-04-28 DIAGNOSIS — X58XXXA Exposure to other specified factors, initial encounter: Secondary | ICD-10-CM | POA: Insufficient documentation

## 2023-04-28 DIAGNOSIS — S39012A Strain of muscle, fascia and tendon of lower back, initial encounter: Secondary | ICD-10-CM | POA: Diagnosis not present

## 2023-04-28 DIAGNOSIS — M5432 Sciatica, left side: Secondary | ICD-10-CM | POA: Insufficient documentation

## 2023-04-28 DIAGNOSIS — M5441 Lumbago with sciatica, right side: Secondary | ICD-10-CM | POA: Diagnosis not present

## 2023-04-28 MED ORDER — CYCLOBENZAPRINE HCL 10 MG PO TABS: 10.0000 mg | ORAL_TABLET | Freq: Two times a day (BID) | ORAL | 0 refills | Status: DC | PRN

## 2023-04-28 MED ORDER — BACLOFEN 10 MG PO TABS: 10.0000 mg | ORAL_TABLET | Freq: Three times a day (TID) | ORAL | 0 refills | Status: DC | PRN

## 2023-04-28 MED ORDER — KETOROLAC TROMETHAMINE 30 MG/ML IJ SOLN
30.0000 mg | Freq: Once | INTRAMUSCULAR | Status: AC
  Administered 2023-04-28: 30 mg via INTRAMUSCULAR
  Filled 2023-04-28: qty 1

## 2023-04-28 NOTE — Discharge Instructions (Addendum)
You were seen in the ER for ongoing low back pain.  As we discussed, I think you likely strained a muscle in your lower back, which is compounding on the chronic pain you already have. We agreed to do an injection of toradol in the ER, and I'm sending you prescriptions for muscle relaxers to try.   As you know, do not take both medications together as they can make you very drowsy. Try one at a time and see if either work for you. Continue using a heating pad or try lidocaine patches from the pharmacy.  Please follow up with the spine specialists as scheduled.  Continue to monitor how you're doing and return to the ER for new or worsening symptoms such as worsening numbness, numbness in your groin, any difficulties using the bathroom (inability to pee, incontinence), etc.

## 2023-04-28 NOTE — ED Triage Notes (Signed)
Pt arrived POV.  Pt was working in his garden on an uneven surface and thinks he put too much weight on L leg and pt started having muscle spasm in back and pain in L leg and back.   Hx of sciatica since Christmas. MRI completed in Feb showed 4 bulging lumbar discs, stenosis of nerves. Steroids didn't work, lumbar injections helped but wore off. Pt took 2 oxycodone this morning PTA.

## 2023-04-28 NOTE — ED Provider Notes (Signed)
[facility] Provider Note   CSN: [id number] Arrival date & time: 04/28/23  2952     History  Chief Complaint  Patient presents with   Back Pain    Isaiah Todd is a 72 y.o. male with history of arthritis, prostate cancer, who presents to the ER complaining of back pain. States that he was working in his garden on an uneven surface and started having muscle spasms in her back with pain in his L leg as well. Reports hx of sciatica for 6 months of so, had an MRI in February. States steroids did not help, and lumbar injections have worsen off. Took 2 oxycodone this morning prior to ER arrival. No saddle paresthesias. Using the bathroom normally, no retention or incontinence. No fever or chills. No hx of IV drug use.   Patient is a retired Teacher, early years/pre.   Back Pain      Home Medications Prior to Admission medications   Medication Sig Start Date End Date Taking? Authorizing Provider  baclofen (LIORESAL) 10 MG tablet Take 1 tablet (10 mg total) by mouth 3 (three) times daily as needed for muscle spasms. 04/28/23  Yes Mckinleigh Schuchart T, PA-C  cyclobenzaprine (FLEXERIL) 10 MG tablet Take 1 tablet (10 mg total) by mouth 2 (two) times daily as needed for muscle spasms. 04/28/23  Yes Ruthann Angulo T, PA-C  dextromethorphan-guaiFENesin (MUCINEX DM) 30-600 MG 12hr tablet Take 1 tablet by mouth 2 (two) times daily. 08/16/20   Marinda Elk, MD  Multiple Vitamin (MULTIVITAMIN) tablet Take 1 tablet by mouth daily.    [provider]      Allergies    Patient has no known allergies.    Review of Systems   Review of Systems  Musculoskeletal:  Positive for back pain.    Physical Exam Updated Vital Signs BP (!) 139/90 (BP Location: Right Arm)   Pulse 72   Temp 98.9 F (37.2 C)   Resp 18   Ht 5' 6.5" (1.689 m)   Wt 93.4 kg   SpO2 99%   BMI 32.75 kg/m  Physical Exam Vitals and nursing note reviewed.  Constitutional:       Appearance: Normal appearance.  HENT:     Head: Normocephalic and atraumatic.  Eyes:     Conjunctiva/sclera: Conjunctivae normal.  Pulmonary:     Effort: Pulmonary effort is normal. No respiratory distress.  Musculoskeletal:     Comments: Slight decreased passive ROM of all lumbar spine due to pain.  No midline spinal tenderness, step-offs or crepitus.  Strength 5/5 in all extremities.  Sensation intact in all extremities. Left lower lumbar muscular tenderness to palpation with intermittent spasms.   Skin:    General: Skin is warm and dry.  Neurological:     Mental Status: He is alert.  Psychiatric:        Mood and Affect: Mood normal.        Behavior: Behavior normal.     ED Results / Procedures / Treatments   Labs (all labs ordered are listed, but only abnormal results are displayed) Labs Reviewed - No data to display  EKG None  Radiology No results found.  Procedures Procedures    Medications Ordered in ED Medications  ketorolac (TORADOL) 30 MG/ML injection 30 mg (30 mg Intramuscular Given 04/28/23 1115)    ED Course/ Medical Decision Making/ A&P  Medical Decision Making Risk Prescription drug management.   This patient is a 72 y.o. male  who presents to the ED for concern of low back pain.   Differential diagnoses prior to evaluation: The emergent differential diagnosis includes, but is not limited to,  Fracture (acute/chronic), muscle strain, cauda equina / myelopathy, spinal stenosis, DDD, ligamentous injury, disk herniation, radiculopathy. This is not an exhaustive differential.   Past Medical History / Co-morbidities / Social History: arthritis, prostate cancer  Additional history: Chart reviewed. Pertinent results include: Reviewed most recent encounter with [facility] on 5/6, was still having pain with long periods of sitting or standing. Reportedly has gone through physical therapy, oral steroids, and  injections. Uses oxycodone 5 mg intermittently for pain control.   Normal PSA as of March 2023  Most recent labs in system in Oct 2023 with stable kidney function, GFR > 60  Physical Exam: Physical exam performed. The pertinent findings include: Left lower lumbar muscular tenderness to palpation with intermittent spasms. No midline spinal tenderness, step offs or crepitus. Normal strength and sensation in all extremities.   Medications: I ordered medication including toradol.   I have reviewed the patients home medicines and have made adjustments as needed.   Disposition: After consideration of the diagnostic results and the patients response to treatment, I feel that emergency department workup does not suggest an emergent condition requiring admission or immediate intervention beyond what has been performed at this time. Low concern for myelopathy due to reassuring examination and no red flag symptoms. Not requiring back imaging at this time. The plan is: discharge to home with symptomatic management of likely lumbar muscular strain compounded on ongoing chronic back pain. Patient would like to try a muscle relaxer. States robaxin made him too sleepy in the past. Has not tried either baclofen or flexeril. He is asking for prescriptions for both and he will try one at a time. We had a thorough discussion about the dangers of taking these two medications together due to their sedating effect, and he assures he will take them separately. He plans to follow up with his spine specialist at his scheduled appointment next week.  The patient is safe for discharge and has been instructed to return immediately for worsening symptoms, change in symptoms or any other concerns.  Final Clinical Impression(s) / ED Diagnoses Final diagnoses:  Strain of lumbar region, initial encounter  Chronic bilateral low back pain with bilateral sciatica    Rx / DC Orders ED Discharge Orders          Ordered     baclofen (LIORESAL) 10 MG tablet  3 times daily PRN        04/28/23 1109    cyclobenzaprine (FLEXERIL) 10 MG tablet  2 times daily PRN        04/28/23 1109           Portions of this report may have been transcribed using voice recognition software. Every effort was made to ensure accuracy; however, inadvertent computerized transcription errors may be present.    Jeanella Flattery 04/29/23 1001    Terald Sleeper, MD 05/02/23 [phone]

## 2023-05-10 DIAGNOSIS — M5136 Other intervertebral disc degeneration, lumbar region: Secondary | ICD-10-CM | POA: Diagnosis not present

## 2023-05-10 DIAGNOSIS — Z6833 Body mass index (BMI) 33.0-33.9, adult: Secondary | ICD-10-CM | POA: Diagnosis not present

## 2023-05-10 DIAGNOSIS — M5416 Radiculopathy, lumbar region: Secondary | ICD-10-CM | POA: Diagnosis not present

## 2023-05-23 DIAGNOSIS — M5416 Radiculopathy, lumbar region: Secondary | ICD-10-CM | POA: Diagnosis not present

## 2023-06-09 DIAGNOSIS — Z8546 Personal history of malignant neoplasm of prostate: Secondary | ICD-10-CM | POA: Diagnosis not present

## 2023-06-09 DIAGNOSIS — N393 Stress incontinence (female) (male): Secondary | ICD-10-CM | POA: Diagnosis not present

## 2023-06-09 DIAGNOSIS — N5201 Erectile dysfunction due to arterial insufficiency: Secondary | ICD-10-CM | POA: Diagnosis not present

## 2023-06-28 DIAGNOSIS — M5416 Radiculopathy, lumbar region: Secondary | ICD-10-CM | POA: Diagnosis not present

## 2023-06-28 DIAGNOSIS — Z6834 Body mass index (BMI) 34.0-34.9, adult: Secondary | ICD-10-CM | POA: Diagnosis not present

## 2023-07-18 DIAGNOSIS — K648 Other hemorrhoids: Secondary | ICD-10-CM | POA: Diagnosis not present

## 2023-07-18 DIAGNOSIS — Z09 Encounter for follow-up examination after completed treatment for conditions other than malignant neoplasm: Secondary | ICD-10-CM | POA: Diagnosis not present

## 2023-07-18 DIAGNOSIS — D122 Benign neoplasm of ascending colon: Secondary | ICD-10-CM | POA: Diagnosis not present

## 2023-07-18 DIAGNOSIS — Z86010 Personal history of colonic polyps: Secondary | ICD-10-CM | POA: Diagnosis not present

## 2023-07-18 DIAGNOSIS — D123 Benign neoplasm of transverse colon: Secondary | ICD-10-CM | POA: Diagnosis not present

## 2023-07-20 DIAGNOSIS — D123 Benign neoplasm of transverse colon: Secondary | ICD-10-CM | POA: Diagnosis not present

## 2023-07-26 DIAGNOSIS — M5416 Radiculopathy, lumbar region: Secondary | ICD-10-CM | POA: Diagnosis not present

## 2023-07-28 DIAGNOSIS — M7989 Other specified soft tissue disorders: Secondary | ICD-10-CM | POA: Diagnosis not present

## 2023-07-28 DIAGNOSIS — Z86718 Personal history of other venous thrombosis and embolism: Secondary | ICD-10-CM | POA: Diagnosis not present

## 2023-07-28 DIAGNOSIS — I700 Atherosclerosis of aorta: Secondary | ICD-10-CM | POA: Diagnosis not present

## 2023-08-29 DIAGNOSIS — Z79899 Other long term (current) drug therapy: Secondary | ICD-10-CM | POA: Diagnosis not present

## 2023-08-29 DIAGNOSIS — E7800 Pure hypercholesterolemia, unspecified: Secondary | ICD-10-CM | POA: Diagnosis not present

## 2023-08-29 DIAGNOSIS — Z8546 Personal history of malignant neoplasm of prostate: Secondary | ICD-10-CM | POA: Diagnosis not present

## 2023-08-30 DIAGNOSIS — Z6835 Body mass index (BMI) 35.0-35.9, adult: Secondary | ICD-10-CM | POA: Diagnosis not present

## 2023-08-30 DIAGNOSIS — M5416 Radiculopathy, lumbar region: Secondary | ICD-10-CM | POA: Diagnosis not present

## 2023-08-30 DIAGNOSIS — M48061 Spinal stenosis, lumbar region without neurogenic claudication: Secondary | ICD-10-CM | POA: Diagnosis not present

## 2023-10-10 ENCOUNTER — Other Ambulatory Visit: Payer: Self-pay | Admitting: Neurological Surgery

## 2023-10-11 NOTE — Pre-Procedure Instructions (Addendum)
Surgical Instructions   Your procedure is scheduled on October 17, 2023. Report to [facility] Main Entrance "A" at 8:20 A.M., then check in with the Admitting office. Any questions or running late day of surgery: call [phone]  Questions prior to your surgery date: call [phone], Monday-Friday, 8am-4pm. If you experience any cold or flu symptoms such as cough, fever, chills, shortness of breath, etc. between now and your scheduled surgery, please notify us at the above number.     Remember:  Do not eat or drink after midnight the night before your surgery    Take these medicines the morning of surgery with A SIP OF WATER: gabapentin (NEURONTIN)  rosuvastatin (CRESTOR)    STOP taking your ELIQUIS three days prior to surgery. Your last dose will be December 6th.   One week prior to surgery, STOP taking any Aspirin (unless otherwise instructed by your surgeon) Aleve, Naproxen, Ibuprofen, Motrin, Advil, Goody's, BC's, all herbal medications, fish oil, and non-prescription vitamins.                     Do NOT Smoke (Tobacco/Vaping) for 24 hours prior to your procedure.  If you use a CPAP at night, you may bring your mask/headgear for your overnight stay.   You will be asked to remove any contacts, glasses, piercing's, hearing aid's, dentures/partials prior to surgery. Please bring cases for these items if needed.    Patients discharged the day of surgery will not be allowed to drive home, and someone needs to stay with them for 24 hours.  SURGICAL WAITING ROOM VISITATION Patients may have no more than 2 support people in the waiting area - these visitors may rotate.   Pre-op nurse will coordinate an appropriate time for 1 ADULT support person, who may not rotate, to accompany patient in pre-op.  Children under the age of 54 must have an adult with them who is not the patient and must remain in the main waiting area with an adult.  If the patient needs to stay at the  hospital during part of their recovery, the visitor guidelines for inpatient rooms apply.  Please refer to the [facility] website for the visitor guidelines for any additional information.   If you received a COVID test during your pre-op visit  it is requested that you wear a mask when out in public, stay away from anyone that may not be feeling well and notify your surgeon if you develop symptoms. If you have been in contact with anyone that has tested positive in the last 10 days please notify you surgeon.      Pre-operative 5 CHG Bathing Instructions   You can play a key role in reducing the risk of infection after surgery. Your skin needs to be as free of germs as possible. You can reduce the number of germs on your skin by washing with CHG (chlorhexidine gluconate) soap before surgery. CHG is an antiseptic soap that kills germs and continues to kill germs even after washing.   DO NOT use if you have an allergy to chlorhexidine/CHG or antibacterial soaps. If your skin becomes reddened or irritated, stop using the CHG and notify one of our RNs at [phone].   Please shower with the CHG soap starting 4 days before surgery using the following schedule:     Please keep in mind the following:  DO NOT shave, including legs and underarms, starting the day of your first shower.   You may shave  your face at any point before/day of surgery.  Place clean sheets on your bed the day you start using CHG soap. Use a clean washcloth (not used since being washed) for each shower. DO NOT sleep with pets once you start using the CHG.   CHG Shower Instructions:  Wash your face and private area with normal soap. If you choose to wash your hair, wash first with your normal shampoo.  After you use shampoo/soap, rinse your hair and body thoroughly to remove shampoo/soap residue.  Turn the water OFF and apply about 3 tablespoons (45 ml) of CHG soap to a CLEAN washcloth.  Apply CHG soap ONLY FROM YOUR  NECK DOWN TO YOUR TOES (washing for 3-5 minutes)  DO NOT use CHG soap on face, private areas, open wounds, or sores.  Pay special attention to the area where your surgery is being performed.  If you are having back surgery, having someone wash your back for you may be helpful. Wait 2 minutes after CHG soap is applied, then you may rinse off the CHG soap.  Pat dry with a clean towel  Put on clean clothes/pajamas   If you choose to wear lotion, please use ONLY the CHG-compatible lotions on the back of this paper.   Additional instructions for the day of surgery: DO NOT APPLY any lotions, deodorants, cologne, or perfumes.   Do not bring valuables to the hospital. [facility] is not responsible for any belongings/valuables. Do not wear nail polish, gel polish, artificial nails, or any other type of covering on natural nails (fingers and toes) Do not wear jewelry or makeup Put on clean/comfortable clothes.  Please brush your teeth.  Ask your nurse before applying any prescription medications to the skin.     CHG Compatible Lotions   Aveeno Moisturizing lotion  Cetaphil Moisturizing Cream  Cetaphil Moisturizing Lotion  Clairol Herbal Essence Moisturizing Lotion, Dry Skin  Clairol Herbal Essence Moisturizing Lotion, Extra Dry Skin  Clairol Herbal Essence Moisturizing Lotion, Normal Skin  Curel Age Defying Therapeutic Moisturizing Lotion with Alpha Hydroxy  Curel Extreme Care Body Lotion  Curel Soothing Hands Moisturizing Hand Lotion  Curel Therapeutic Moisturizing Cream, Fragrance-Free  Curel Therapeutic Moisturizing Lotion, Fragrance-Free  Curel Therapeutic Moisturizing Lotion, Original Formula  Eucerin Daily Replenishing Lotion  Eucerin Dry Skin Therapy Plus Alpha Hydroxy Crme  Eucerin Dry Skin Therapy Plus Alpha Hydroxy Lotion  Eucerin Original Crme  Eucerin Original Lotion  Eucerin Plus Crme Eucerin Plus Lotion  Eucerin TriLipid Replenishing Lotion  Keri Anti-Bacterial Hand  Lotion  Keri Deep Conditioning Original Lotion Dry Skin Formula Softly Scented  Keri Deep Conditioning Original Lotion, Fragrance Free Sensitive Skin Formula  Keri Lotion Fast Absorbing Fragrance Free Sensitive Skin Formula  Keri Lotion Fast Absorbing Softly Scented Dry Skin Formula  Keri Original Lotion  Keri Skin Renewal Lotion Keri Silky Smooth Lotion  Keri Silky Smooth Sensitive Skin Lotion  Nivea Body Creamy Conditioning Oil  Nivea Body Extra Enriched Lotion  Nivea Body Original Lotion  Nivea Body Sheer Moisturizing Lotion Nivea Crme  Nivea Skin Firming Lotion  NutraDerm 30 Skin Lotion  NutraDerm Skin Lotion  NutraDerm Therapeutic Skin Cream  NutraDerm Therapeutic Skin Lotion  ProShield Protective Hand Cream  Provon moisturizing lotion  Please read over the following fact sheets that you were given.

## 2023-10-12 ENCOUNTER — Other Ambulatory Visit: Payer: Self-pay

## 2023-10-12 ENCOUNTER — Encounter (HOSPITAL_COMMUNITY): Payer: Self-pay

## 2023-10-12 ENCOUNTER — Encounter (HOSPITAL_COMMUNITY)
Admission: RE | Admit: 2023-10-12 | Discharge: 2023-10-12 | Disposition: A | Discharge status: Home / Self Care | Payer: Medicare HMO | Source: Ambulatory Visit | Attending: Neurological Surgery | Admitting: Neurological Surgery

## 2023-10-12 VITALS — BP 151/76 | HR 67 | Temp 98.7°F | Resp 17 | Ht 66.0 in | Wt 221.6 lb

## 2023-10-12 DIAGNOSIS — Z9079 Acquired absence of other genital organ(s): Secondary | ICD-10-CM | POA: Diagnosis not present

## 2023-10-12 DIAGNOSIS — R001 Bradycardia, unspecified: Secondary | ICD-10-CM | POA: Diagnosis not present

## 2023-10-12 DIAGNOSIS — Z960 Presence of urogenital implants: Secondary | ICD-10-CM | POA: Insufficient documentation

## 2023-10-12 DIAGNOSIS — Z01818 Encounter for other preprocedural examination: Secondary | ICD-10-CM | POA: Insufficient documentation

## 2023-10-12 DIAGNOSIS — Z86718 Personal history of other venous thrombosis and embolism: Secondary | ICD-10-CM | POA: Insufficient documentation

## 2023-10-12 DIAGNOSIS — Z8546 Personal history of malignant neoplasm of prostate: Secondary | ICD-10-CM | POA: Insufficient documentation

## 2023-10-12 HISTORY — DX: Presence of urogenital implants: Z96.0

## 2023-10-12 HISTORY — DX: Acute embolism and thrombosis of unspecified deep veins of unspecified lower extremity: I82.409

## 2023-10-12 HISTORY — DX: Lymphedema, not elsewhere classified: I89.0

## 2023-10-12 LAB — CBC
HCT: 44.5 % (ref 39.0–52.0)
Hemoglobin: 13.8 g/dL (ref 13.0–17.0)
MCH: 27.4 pg (ref 26.0–34.0)
MCHC: 31 g/dL (ref 30.0–36.0)
MCV: 88.3 fL (ref 80.0–100.0)
Platelets: 163 10*3/uL (ref 150–400)
RBC: 5.04 MIL/uL (ref 4.22–5.81)
RDW: 15.9 % — ABNORMAL HIGH (ref 11.5–15.5)
WBC: 5.7 10*3/uL (ref 4.0–10.5)
nRBC: 0 % (ref 0.0–0.2)

## 2023-10-12 LAB — SURGICAL PCR SCREEN
MRSA, PCR: POSITIVE — AB
Staphylococcus aureus: POSITIVE — AB

## 2023-10-12 LAB — BASIC METABOLIC PANEL
Anion gap: 7 (ref 5–15)
BUN: 18 mg/dL (ref 8–23)
CO2: 23 mmol/L (ref 22–32)
Calcium: 9 mg/dL (ref 8.9–10.3)
Chloride: 108 mmol/L (ref 98–111)
Creatinine, Ser: 1.18 mg/dL (ref 0.61–1.24)
GFR, Estimated: >60 mL/min (ref >60)
Glucose, Bld: 82 mg/dL (ref 70–99)
Potassium: 3.7 mmol/L (ref 3.5–5.1)
Sodium: 138 mmol/L (ref 135–145)

## 2023-10-12 NOTE — Progress Notes (Signed)
PCP - Dr. Eleanora Neighbor Cardiologist - denies Urologist- Dr. Irine Seal  PPM/ICD - denies   Chest x-ray - 08/07/22 EKG - 10/12/23 Stress Test - denies ECHO - around 2023 with Dr. Merlene Laughter at [facility]  Cardiac Cath - denies  Sleep Study - denies   DM- denies  Last dose of GLP1 agonist-  n/a   Blood Thinner Instructions: Hold Eliquis 3 days. Last dose 12/6. Lovenox bridging starts on 12/7, q12 hours. Hold Lovenox 24 hours prior to surgery  Aspirin Instructions: n/a  ERAS Protcol - no, NPO   COVID TEST- n/a   Anesthesia review: yes, Fayrene Fearing is aware of this pt and saw him in PAT. He does have hx of DVT, doing lovenox bridge for surgery. Pt has an artificial urinary sphincter and will see Dr. Lafonda Mosses for instructions and Fayrene Fearing informed pt that he will f/u with Dr. Lafonda Mosses as well for instructions regarding sphincter. Pt states he has spoke to Dr. Arby Barrette.  Patient denies shortness of breath, fever, cough and chest pain at PAT appointment   All instructions explained to the patient, with a verbal understanding of the material. Patient agrees to go over the instructions while at home for a better understanding. The opportunity to ask questions was provided.

## 2023-10-13 NOTE — Progress Notes (Signed)
Message sent to [facility] regarding pt's positive PCR results

## 2023-10-13 NOTE — Progress Notes (Signed)
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  Patient seen by urologist Dr. Lafonda Mosses 10/16/2023 for perioperative instructions regarding artificial urinary sphincter.  I spoke with the patient on 12/9 after his appointment and he stated to Dr. Lafonda Mosses deactivated the urinary sphincter to allow for placement of catheter during surgery.  It is recommended to use a 14 [place] coud catheter.  Patient has instructions on how to reactivate the device following removal of catheter.  He does report that he has frank urinary incontinence while the catheter is deactivated.  He request that the catheter not be removed until he is awake and alert and able to reactivate the device.  Preop labs reviewed, unremarkable.   EKG 10/12/2023: Sinus bradycardia. Rate 59.    Zannie Cove [facility] Short Stay Center/Anesthesiology Phone [phone] 10/16/2023 3:30 PM]

## 2023-10-16 DIAGNOSIS — N393 Stress incontinence (female) (male): Secondary | ICD-10-CM | POA: Diagnosis not present

## 2023-10-16 DIAGNOSIS — Z8546 Personal history of malignant neoplasm of prostate: Secondary | ICD-10-CM | POA: Diagnosis not present

## 2023-10-16 DIAGNOSIS — Z960 Presence of urogenital implants: Secondary | ICD-10-CM | POA: Diagnosis not present

## 2023-10-16 NOTE — Anesthesia Preprocedure Evaluation (Signed)
Anesthesia Evaluation  Patient identified by MRN, date of birth, ID band Patient awake    Reviewed: Allergy & Precautions, NPO status , Patient's Chart, lab work & pertinent test results  Airway Mallampati: II  TM Distance: >3 FB Neck ROM: Full    Dental  (+) Teeth Intact, Dental Advisory Given   Pulmonary neg pulmonary ROS   Pulmonary exam normal breath sounds clear to auscultation       Cardiovascular + DVT  Normal cardiovascular exam Rhythm:Regular Rate:Normal     Neuro/Psych negative neurological ROS  negative psych ROS   GI/Hepatic negative GI ROS, Neg liver ROS,,,  Endo/Other    Class 3 obesity  Renal/GU negative Renal ROS   H/o prostate cancer Status post implantation of artificial urinary sphincter     Musculoskeletal  (+) Arthritis ,    Abdominal  (+) + obese  Peds  Hematology  (+) Blood dyscrasia (Eliquis)   Anesthesia Other Findings Day of surgery medications reviewed with the patient.  Reproductive/Obstetrics                             Anesthesia Physical Anesthesia Plan  ASA: 3  Anesthesia Plan: General   Post-op Pain Management: Tylenol PO (pre-op)*   Induction: Intravenous  PONV Risk Score and Plan: 2 and Dexamethasone and Ondansetron  Airway Management Planned: Oral ETT  Additional Equipment: None  Intra-op Plan:   Post-operative Plan: Extubation in OR  Informed Consent: I have reviewed the patients History and Physical, chart, labs and discussed the procedure including the risks, benefits and alternatives for the proposed anesthesia with the patient or authorized representative who has indicated his/her understanding and acceptance.     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  Patient seen by urologist Dr. Lafonda Mosses 10/16/2023 for perioperative instructions regarding artificial urinary sphincter.  I spoke with the patient on 12/9 after his appointment and he stated to Dr. Lafonda Mosses deactivated the urinary sphincter to allow for placement of catheter during surgery.  It is recommended to use a 14 [place] coud catheter.  Patient has instructions on how to reactivate the device following removal of catheter.  He does report that he has frank urinary incontinence while the catheter is deactivated.  He request that the catheter not be removed until he is awake and alert and able to reactivate the device.  Preop labs reviewed, unremarkable.   EKG 10/12/2023: Sinus bradycardia. Rate 59.  )        Anesthesia Quick Evaluation

## 2023-10-17 ENCOUNTER — Encounter (HOSPITAL_COMMUNITY)
Admission: RE | Disposition: A | Discharge status: Home / Self Care | Payer: Self-pay | Source: Home / Self Care | Attending: Neurological Surgery

## 2023-10-17 ENCOUNTER — Other Ambulatory Visit: Payer: Self-pay

## 2023-10-17 ENCOUNTER — Ambulatory Visit (HOSPITAL_BASED_OUTPATIENT_CLINIC_OR_DEPARTMENT_OTHER): Payer: Medicare HMO | Admitting: Anesthesiology

## 2023-10-17 ENCOUNTER — Ambulatory Visit (HOSPITAL_COMMUNITY): Payer: Medicare HMO

## 2023-10-17 ENCOUNTER — Encounter (HOSPITAL_COMMUNITY): Payer: Self-pay | Admitting: Neurological Surgery

## 2023-10-17 ENCOUNTER — Observation Stay (HOSPITAL_COMMUNITY)
Admission: RE | Admit: 2023-10-17 | Discharge: 2023-10-18 | Disposition: A | Discharge status: Home / Self Care | Payer: Medicare HMO | Attending: Neurological Surgery | Admitting: Neurological Surgery

## 2023-10-17 ENCOUNTER — Ambulatory Visit (HOSPITAL_COMMUNITY): Payer: Medicare HMO | Admitting: Physician Assistant

## 2023-10-17 DIAGNOSIS — Z8546 Personal history of malignant neoplasm of prostate: Secondary | ICD-10-CM | POA: Insufficient documentation

## 2023-10-17 DIAGNOSIS — M5416 Radiculopathy, lumbar region: Secondary | ICD-10-CM | POA: Diagnosis not present

## 2023-10-17 DIAGNOSIS — Z9689 Presence of other specified functional implants: Secondary | ICD-10-CM | POA: Diagnosis not present

## 2023-10-17 DIAGNOSIS — Z86718 Personal history of other venous thrombosis and embolism: Secondary | ICD-10-CM | POA: Insufficient documentation

## 2023-10-17 DIAGNOSIS — M5116 Intervertebral disc disorders with radiculopathy, lumbar region: Secondary | ICD-10-CM | POA: Diagnosis not present

## 2023-10-17 DIAGNOSIS — Z79899 Other long term (current) drug therapy: Secondary | ICD-10-CM | POA: Insufficient documentation

## 2023-10-17 DIAGNOSIS — Z7901 Long term (current) use of anticoagulants: Secondary | ICD-10-CM | POA: Insufficient documentation

## 2023-10-17 DIAGNOSIS — M48061 Spinal stenosis, lumbar region without neurogenic claudication: Principal | ICD-10-CM | POA: Diagnosis present

## 2023-10-17 HISTORY — PX: LUMBAR LAMINECTOMY/DECOMPRESSION MICRODISCECTOMY: SHX5026

## 2023-10-17 SURGERY — LUMBAR LAMINECTOMY/DECOMPRESSION MICRODISCECTOMY 3 LEVELS
Anesthesia: General

## 2023-10-17 MED ORDER — LIDOCAINE-EPINEPHRINE 1 %-1:100000 IJ SOLN
INTRAMUSCULAR | Status: AC
  Filled 2023-10-17: qty 1

## 2023-10-17 MED ORDER — CHLORHEXIDINE GLUCONATE CLOTH 2 % EX PADS
6.0000 | MEDICATED_PAD | Freq: Once | CUTANEOUS | Status: DC
Start: 2023-10-17 — End: 2023-10-17

## 2023-10-17 MED ORDER — BUPIVACAINE LIPOSOME 1.3 % IJ SUSP
INTRAMUSCULAR | Status: AC
  Filled 2023-10-17: qty 20

## 2023-10-17 MED ORDER — CEFAZOLIN SODIUM-DEXTROSE 2-4 GM/100ML-% IV SOLN
2.0000 g | Freq: Four times a day (QID) | INTRAVENOUS | Status: AC
  Administered 2023-10-17 (×2): 2 g via INTRAVENOUS
  Filled 2023-10-17 (×2): qty 100

## 2023-10-17 MED ORDER — ACETAMINOPHEN 650 MG RE SUPP: 650.0000 mg | RECTAL | Status: DC | PRN

## 2023-10-17 MED ORDER — GLYCOPYRROLATE PF 0.2 MG/ML IJ SOSY
PREFILLED_SYRINGE | INTRAMUSCULAR | Status: AC
  Filled 2023-10-17: qty 1

## 2023-10-17 MED ORDER — 0.9 % SODIUM CHLORIDE (POUR BTL) OPTIME
TOPICAL | Status: DC | PRN
  Administered 2023-10-17: 1000 mL

## 2023-10-17 MED ORDER — ONDANSETRON HCL 4 MG PO TABS
4.0000 mg | ORAL_TABLET | Freq: Four times a day (QID) | ORAL | Status: DC | PRN
Start: 2023-10-17 — End: 2023-10-18

## 2023-10-17 MED ORDER — MICROFIBRILLAR COLL HEMOSTAT EX POWD
CUTANEOUS | Status: DC | PRN
  Administered 2023-10-17: 1 g via TOPICAL

## 2023-10-17 MED ORDER — BUPIVACAINE-EPINEPHRINE (PF) 0.5% -1:200000 IJ SOLN
INTRAMUSCULAR | Status: DC | PRN
  Administered 2023-10-17: 5 mL via PERINEURAL

## 2023-10-17 MED ORDER — KETOROLAC TROMETHAMINE 15 MG/ML IJ SOLN
7.5000 mg | Freq: Four times a day (QID) | INTRAMUSCULAR | Status: AC
  Administered 2023-10-17 – 2023-10-18 (×4): 7.5 mg via INTRAVENOUS
  Filled 2023-10-17 (×4): qty 1

## 2023-10-17 MED ORDER — PHENOL 1.4 % MT LIQD: 1.0000 | OROMUCOSAL | Status: DC | PRN

## 2023-10-17 MED ORDER — MUPIROCIN 2 % EX OINT: 1.0000 | TOPICAL_OINTMENT | Freq: Two times a day (BID) | CUTANEOUS | 0 refills | Status: AC

## 2023-10-17 MED ORDER — FENTANYL CITRATE (PF) 100 MCG/2ML IJ SOLN: 25.0000 ug | INTRAMUSCULAR | Status: DC | PRN

## 2023-10-17 MED ORDER — METHYLPREDNISOLONE ACETATE 80 MG/ML IJ SUSP
INTRAMUSCULAR | Status: AC
  Filled 2023-10-17: qty 1

## 2023-10-17 MED ORDER — OXYCODONE HCL 5 MG PO TABS
5.0000 mg | ORAL_TABLET | ORAL | Status: DC | PRN
  Administered 2023-10-17: 5 mg via ORAL
  Filled 2023-10-17: qty 1

## 2023-10-17 MED ORDER — LIDOCAINE-EPINEPHRINE 1 %-1:100000 IJ SOLN
INTRAMUSCULAR | Status: DC | PRN
  Administered 2023-10-17: 5 mL

## 2023-10-17 MED ORDER — DOCUSATE SODIUM 100 MG PO CAPS
100.0000 mg | ORAL_CAPSULE | Freq: Two times a day (BID) | ORAL | Status: DC
  Administered 2023-10-17 – 2023-10-18 (×3): 100 mg via ORAL
  Filled 2023-10-17 (×3): qty 1

## 2023-10-17 MED ORDER — CEFAZOLIN SODIUM-DEXTROSE 2-4 GM/100ML-% IV SOLN
2.0000 g | INTRAVENOUS | Status: AC
  Administered 2023-10-17: 2 g via INTRAVENOUS
  Filled 2023-10-17: qty 100

## 2023-10-17 MED ORDER — FLEET ENEMA RE ENEM: 1.0000 | ENEMA | Freq: Once | RECTAL | Status: DC | PRN

## 2023-10-17 MED ORDER — SUGAMMADEX SODIUM 200 MG/2ML IV SOLN
INTRAVENOUS | Status: DC | PRN
  Administered 2023-10-17: 200 mg via INTRAVENOUS

## 2023-10-17 MED ORDER — ONDANSETRON HCL 4 MG/2ML IJ SOLN
INTRAMUSCULAR | Status: AC
  Filled 2023-10-17: qty 2

## 2023-10-17 MED ORDER — PHENYLEPHRINE 80 MCG/ML (10ML) SYRINGE FOR IV PUSH (FOR BLOOD PRESSURE SUPPORT)
PREFILLED_SYRINGE | INTRAVENOUS | Status: DC | PRN
  Administered 2023-10-17: 80 ug via INTRAVENOUS

## 2023-10-17 MED ORDER — SENNA 8.6 MG PO TABS
1.0000 | ORAL_TABLET | Freq: Two times a day (BID) | ORAL | Status: DC | PRN
  Filled 2023-10-17: qty 1

## 2023-10-17 MED ORDER — HYDROMORPHONE HCL 1 MG/ML IJ SOLN
INTRAMUSCULAR | Status: AC
  Filled 2023-10-17: qty 0.5

## 2023-10-17 MED ORDER — MENTHOL 3 MG MT LOZG: 1.0000 | LOZENGE | OROMUCOSAL | Status: DC | PRN

## 2023-10-17 MED ORDER — ONDANSETRON HCL 4 MG/2ML IJ SOLN
INTRAMUSCULAR | Status: DC | PRN
  Administered 2023-10-17: 4 mg via INTRAVENOUS

## 2023-10-17 MED ORDER — ONDANSETRON HCL 4 MG/2ML IJ SOLN
4.0000 mg | Freq: Four times a day (QID) | INTRAMUSCULAR | Status: DC | PRN
Start: 2023-10-17 — End: 2023-10-18

## 2023-10-17 MED ORDER — HYDROMORPHONE HCL 1 MG/ML IJ SOLN
INTRAMUSCULAR | Status: DC | PRN
  Administered 2023-10-17 (×2): .25 mg via INTRAVENOUS

## 2023-10-17 MED ORDER — BUPIVACAINE LIPOSOME 1.3 % IJ SUSP
INTRAMUSCULAR | Status: DC | PRN
  Administered 2023-10-17: 20 mL

## 2023-10-17 MED ORDER — FENTANYL CITRATE (PF) 250 MCG/5ML IJ SOLN
INTRAMUSCULAR | Status: AC
  Filled 2023-10-17: qty 5

## 2023-10-17 MED ORDER — LIDOCAINE 2% (20 MG/ML) 5 ML SYRINGE
INTRAMUSCULAR | Status: DC | PRN
  Administered 2023-10-17: 100 mg via INTRAVENOUS

## 2023-10-17 MED ORDER — HEPARIN SODIUM (PORCINE) 5000 UNIT/ML IJ SOLN: 5000.0000 [IU] | Freq: Three times a day (TID) | INTRAMUSCULAR | Status: DC

## 2023-10-17 MED ORDER — BUPIVACAINE-EPINEPHRINE (PF) 0.5% -1:200000 IJ SOLN
INTRAMUSCULAR | Status: AC
Start: 2023-10-17 — End: ?
  Filled 2023-10-17: qty 30

## 2023-10-17 MED ORDER — LACTATED RINGERS IV SOLN
INTRAVENOUS | Status: DC
Start: 2023-10-17 — End: 2023-10-17

## 2023-10-17 MED ORDER — FENTANYL CITRATE (PF) 100 MCG/2ML IJ SOLN
INTRAMUSCULAR | Status: DC | PRN
  Administered 2023-10-17: 50 ug via INTRAVENOUS

## 2023-10-17 MED ORDER — METHOCARBAMOL 500 MG PO TABS: 500.0000 mg | ORAL_TABLET | Freq: Four times a day (QID) | ORAL | Status: DC | PRN

## 2023-10-17 MED ORDER — MICROFIBRILLAR COLL HEMOSTAT EX POWD
CUTANEOUS | Status: AC
Start: 2023-10-17 — End: ?
  Filled 2023-10-17: qty 5

## 2023-10-17 MED ORDER — FENTANYL CITRATE (PF) 100 MCG/2ML IJ SOLN
INTRAMUSCULAR | Status: AC
  Filled 2023-10-17: qty 2

## 2023-10-17 MED ORDER — MIDAZOLAM HCL 2 MG/2ML IJ SOLN
INTRAMUSCULAR | Status: AC
  Filled 2023-10-17: qty 2

## 2023-10-17 MED ORDER — CHLORHEXIDINE GLUCONATE 0.12 % MT SOLN
15.0000 mL | Freq: Once | OROMUCOSAL | Status: AC
  Administered 2023-10-17: 15 mL via OROMUCOSAL
  Filled 2023-10-17: qty 15

## 2023-10-17 MED ORDER — ACETAMINOPHEN 325 MG PO TABS: 650.0000 mg | ORAL_TABLET | ORAL | Status: DC | PRN

## 2023-10-17 MED ORDER — ROSUVASTATIN CALCIUM 5 MG PO TABS
5.0000 mg | ORAL_TABLET | Freq: Every day | ORAL | Status: DC
  Administered 2023-10-18: 5 mg via ORAL
  Filled 2023-10-17: qty 1

## 2023-10-17 MED ORDER — SODIUM CHLORIDE 0.9 % IV SOLN: INTRAVENOUS | Status: DC

## 2023-10-17 MED ORDER — POLYETHYLENE GLYCOL 3350 17 G PO PACK: 17.0000 g | PACK | Freq: Every day | ORAL | Status: DC | PRN

## 2023-10-17 MED ORDER — ORAL CARE MOUTH RINSE: 15.0000 mL | Freq: Once | OROMUCOSAL | Status: AC

## 2023-10-17 MED ORDER — VANCOMYCIN HCL 1.5 G IV SOLR
1500.0000 mg | Freq: Once | INTRAVENOUS | Status: AC
  Administered 2023-10-17: 1500 mg via INTRAVENOUS
  Filled 2023-10-17: qty 30

## 2023-10-17 MED ORDER — FENTANYL CITRATE (PF) 250 MCG/5ML IJ SOLN
INTRAMUSCULAR | Status: DC | PRN
  Administered 2023-10-17: 150 ug via INTRAVENOUS

## 2023-10-17 MED ORDER — SODIUM CHLORIDE 0.9% FLUSH
3.0000 mL | Freq: Two times a day (BID) | INTRAVENOUS | Status: DC
Start: 2023-10-17 — End: 2023-10-18
  Administered 2023-10-17 (×2): 3 mL via INTRAVENOUS

## 2023-10-17 MED ORDER — GABAPENTIN 300 MG PO CAPS
300.0000 mg | ORAL_CAPSULE | Freq: Three times a day (TID) | ORAL | Status: DC
  Administered 2023-10-17 – 2023-10-18 (×3): 300 mg via ORAL
  Filled 2023-10-17 (×3): qty 1

## 2023-10-17 MED ORDER — PHENYLEPHRINE 80 MCG/ML (10ML) SYRINGE FOR IV PUSH (FOR BLOOD PRESSURE SUPPORT)
PREFILLED_SYRINGE | INTRAVENOUS | Status: AC
  Filled 2023-10-17: qty 10

## 2023-10-17 MED ORDER — THROMBIN 5000 UNITS EX SOLR
OROMUCOSAL | Status: DC | PRN
  Administered 2023-10-17 (×2): 5 mL via TOPICAL

## 2023-10-17 MED ORDER — DEXAMETHASONE SODIUM PHOSPHATE 10 MG/ML IJ SOLN
INTRAMUSCULAR | Status: AC
  Filled 2023-10-17: qty 1

## 2023-10-17 MED ORDER — THROMBIN (RECOMBINANT) 5000 UNITS EX SOLR
CUTANEOUS | Status: AC
  Filled 2023-10-17: qty 5000

## 2023-10-17 MED ORDER — SODIUM CHLORIDE 0.9% FLUSH: 3.0000 mL | INTRAVENOUS | Status: DC | PRN

## 2023-10-17 MED ORDER — THROMBIN 5000 UNITS EX SOLR
CUTANEOUS | Status: AC
  Filled 2023-10-17: qty 5000

## 2023-10-17 MED ORDER — LIDOCAINE 2% (20 MG/ML) 5 ML SYRINGE
INTRAMUSCULAR | Status: AC
  Filled 2023-10-17: qty 5

## 2023-10-17 MED ORDER — SODIUM CHLORIDE 0.9 % IV SOLN
250.0000 mL | INTRAVENOUS | Status: DC
  Administered 2023-10-17: 250 mL via INTRAVENOUS

## 2023-10-17 MED ORDER — METHYLPREDNISOLONE ACETATE 80 MG/ML IJ SUSP
INTRAMUSCULAR | Status: DC | PRN
  Administered 2023-10-17: 40 mg

## 2023-10-17 MED ORDER — PROPOFOL 10 MG/ML IV BOLUS
INTRAVENOUS | Status: AC
  Filled 2023-10-17: qty 20

## 2023-10-17 MED ORDER — ONDANSETRON HCL 4 MG/2ML IJ SOLN: 4.0000 mg | Freq: Once | INTRAMUSCULAR | Status: DC | PRN

## 2023-10-17 MED ORDER — PROPOFOL 10 MG/ML IV BOLUS
INTRAVENOUS | Status: DC | PRN
  Administered 2023-10-17: 200 mg via INTRAVENOUS

## 2023-10-17 MED ORDER — HEMOSTATIC AGENTS (NO CHARGE) OPTIME
TOPICAL | Status: DC | PRN
  Administered 2023-10-17: 1 via TOPICAL

## 2023-10-17 MED ORDER — OXYCODONE HCL 5 MG PO TABS: 10.0000 mg | ORAL_TABLET | ORAL | Status: DC | PRN

## 2023-10-17 MED ORDER — HYDROMORPHONE HCL 1 MG/ML IJ SOLN: 0.5000 mg | INTRAMUSCULAR | Status: DC | PRN

## 2023-10-17 MED ORDER — ROCURONIUM BROMIDE 10 MG/ML (PF) SYRINGE
PREFILLED_SYRINGE | INTRAVENOUS | Status: DC | PRN
  Administered 2023-10-17: 70 mg via INTRAVENOUS
  Administered 2023-10-17: 20 mg via INTRAVENOUS

## 2023-10-17 MED ORDER — METHOCARBAMOL 1000 MG/10ML IJ SOLN: 500.0000 mg | Freq: Four times a day (QID) | INTRAMUSCULAR | Status: DC | PRN

## 2023-10-17 MED ORDER — ROCURONIUM BROMIDE 10 MG/ML (PF) SYRINGE
PREFILLED_SYRINGE | INTRAVENOUS | Status: AC
  Filled 2023-10-17: qty 10

## 2023-10-17 MED ORDER — ACETAMINOPHEN 500 MG PO TABS
1000.0000 mg | ORAL_TABLET | Freq: Once | ORAL | Status: AC
  Administered 2023-10-17: 1000 mg via ORAL
  Filled 2023-10-17: qty 2

## 2023-10-17 MED ORDER — BACITRACIN ZINC 500 UNIT/GM EX OINT
TOPICAL_OINTMENT | CUTANEOUS | Status: AC
  Filled 2023-10-17: qty 28.35

## 2023-10-17 SURGICAL SUPPLY — 50 items
BAG COUNTER SPONGE SURGICOUNT (BAG) ×1 IMPLANT
BUR CARBIDE MATCH 3.0 (BURR) ×1 IMPLANT
CNTNR URN SCR LID CUP LEK RST (MISCELLANEOUS) ×1 IMPLANT
COVER MAYO STAND STRL (DRAPES) ×1 IMPLANT
DERMABOND ADVANCED .7 DNX12 (GAUZE/BANDAGES/DRESSINGS) IMPLANT
DRAIN JACKSON RD 7FR 3/32 (WOUND CARE) IMPLANT
DRAPE C-ARM 42X72 X-RAY (DRAPES) ×1 IMPLANT
DRAPE LAPAROTOMY 100X72X124 (DRAPES) ×1 IMPLANT
DRAPE MICROSCOPE SLANT 54X150 (MISCELLANEOUS) ×1 IMPLANT
DRAPE SURG 17X23 STRL (DRAPES) ×1 IMPLANT
DRSG OPSITE POSTOP 4X8 (GAUZE/BANDAGES/DRESSINGS) IMPLANT
DURAPREP 26ML APPLICATOR (WOUND CARE) ×1 IMPLANT
ELECT BLADE INSULATED 4IN (ELECTROSURGICAL) ×1 IMPLANT
ELECT COATED BLADE 2.86 ST (ELECTRODE) ×1 IMPLANT
ELECT REM PT RETURN 9FT ADLT (ELECTROSURGICAL) ×1 IMPLANT
ELECTRODE BLADE INSULATED 4IN (ELECTROSURGICAL) ×1 IMPLANT
ELECTRODE REM PT RTRN 9FT ADLT (ELECTROSURGICAL) ×1 IMPLANT
EVACUATOR 1/8 PVC DRAIN (DRAIN) IMPLANT
GAUZE 4X4 16PLY ~~LOC~~+RFID DBL (SPONGE) IMPLANT
GAUZE SPONGE 4X4 12PLY STRL (GAUZE/BANDAGES/DRESSINGS) ×1 IMPLANT
GLOVE BIO SURGEON STRL SZ7 (GLOVE) ×1 IMPLANT
GLOVE BIOGEL PI IND STRL 7.5 (GLOVE) ×1 IMPLANT
GLOVE BIOGEL PI IND STRL 8 (GLOVE) ×1 IMPLANT
GLOVE ECLIPSE 8.0 STRL XLNG CF (GLOVE) ×2 IMPLANT
GOWN STRL REUS W/ TWL LRG LVL3 (GOWN DISPOSABLE) IMPLANT
GOWN STRL REUS W/ TWL XL LVL3 (GOWN DISPOSABLE) ×2 IMPLANT
GOWN STRL REUS W/TWL 2XL LVL3 (GOWN DISPOSABLE) IMPLANT
HEMOSTAT POWDER KIT SURGIFOAM (HEMOSTASIS) ×1 IMPLANT
KIT BASIN OR (CUSTOM PROCEDURE TRAY) ×1 IMPLANT
KIT POSITION SURG JACKSON T1 (MISCELLANEOUS) ×1 IMPLANT
KIT TURNOVER KIT B (KITS) ×1 IMPLANT
MARKER SKIN DUAL TIP RULER LAB (MISCELLANEOUS) ×1 IMPLANT
NDL HYPO 25X1 1.5 SAFETY (NEEDLE) ×1 IMPLANT
NEEDLE HYPO 25X1 1.5 SAFETY (NEEDLE) ×1 IMPLANT
NS IRRIG 1000ML POUR BTL (IV SOLUTION) ×1 IMPLANT
PACK LAMINECTOMY NEURO (CUSTOM PROCEDURE TRAY) ×1 IMPLANT
PAD ARMBOARD 7.5X6 YLW CONV (MISCELLANEOUS) ×3 IMPLANT
PATTIES SURGICAL .5 X.5 (GAUZE/BANDAGES/DRESSINGS) IMPLANT
PATTIES SURGICAL .5 X1 (DISPOSABLE) IMPLANT
PATTIES SURGICAL 1X1 (DISPOSABLE) IMPLANT
SPIKE FLUID TRANSFER (MISCELLANEOUS) ×1 IMPLANT
SPONGE SURGIFOAM ABS GEL SZ50 (HEMOSTASIS) ×1 IMPLANT
SPONGE T-LAP 4X18 ~~LOC~~+RFID (SPONGE) IMPLANT
STAPLER VISISTAT 35W (STAPLE) IMPLANT
SUT VIC AB 0 CT1 18XCR BRD8 (SUTURE) ×1 IMPLANT
SUT VIC AB 2-0 CP2 18 (SUTURE) ×1 IMPLANT
SUT VIC AB 3-0 SH 8-18 (SUTURE) ×1 IMPLANT
TOWEL GREEN STERILE (TOWEL DISPOSABLE) ×1 IMPLANT
TOWEL GREEN STERILE FF (TOWEL DISPOSABLE) ×1 IMPLANT
WATER STERILE IRR 1000ML POUR (IV SOLUTION) ×1 IMPLANT

## 2023-10-17 NOTE — H&P (Signed)
Providing Compassionate, Quality Care - Together  NEUROSURGERY HISTORY & PHYSICAL   KODY WEINMANN is an 72 y.o. male.   Chief Complaint: Left lower extremity weakness and radiculopathy HPI: This is a 72 year old male with progressively worsening bilateral lower extremity radiculopathy, mostly worse on the left side with lower extremity weakness.  Workup revealed multifactorial lumbar stenosis at L2-3, L3-4 and L4-5.  He failed multiple conservative measures and therefore presents today for surgical intervention.  He did receive medical clearance, was given a Lovenox bridge as he does take Eliquis for history of DVTs.  Past Medical History:  Diagnosis Date   Arthritis    knees   BPH (benign prostatic hyperplasia)    no current med.   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   Dental crowns present    DVT (deep venous thrombosis) (HCC)    history of DVTs twice   DVT prophylaxis    Lymphedema    bilateral LE   Medial meniscus tear 08/2015   right knee   Pneumonia 08/14/2020   Status post implantation of artificial urinary sphincter    No cath without urology consult    Past Surgical History:  Procedure Laterality Date   CHONDROPLASTY Left 04/09/2015   Procedure: CHONDROPLASTY;  Surgeon: Mckinley Jewel, MD;  Location: [facility];  Service: Orthopedics;  Laterality: Left;   COLONOSCOPY     recent colonoscopy 2024- 3 benign polyps   HERNIA REPAIR     umbilical   HERNIA REPAIR Left    inguinal   KNEE ARTHROSCOPY WITH MEDIAL MENISECTOMY Left 04/09/2015   Procedure: LEFT KNEE ARTHROSCOPY CHONDROPLASTY, PARIAL MEDIAL   MENISCECTOMY;  Surgeon: Mckinley Jewel, MD;  Location: [facility];  Service: Orthopedics;  Laterality: Left;   KNEE ARTHROSCOPY WITH MEDIAL MENISECTOMY Right 08/27/2015   Procedure: RIGHT KNEE ARTHROSCOPY, CHONDROPLASTY WITH PARTIAL MEDIAL MENISCECTOMY;  Surgeon: Loreta Ave, MD;  Location: [facility];  Service: Orthopedics;  Laterality: Right;   PROSTATECTOMY     SCROTAL EXPLORATION     URINARY SPHINCTER IMPLANT     VASECTOMY      History reviewed. No pertinent family history. Social History:  reports that he has never smoked. He has never used smokeless tobacco. He reports current alcohol use. He reports that he does not use drugs.  Allergies: No Known Allergies  Medications Prior to Admission  Medication Sig Dispense Refill   ELIQUIS 5 MG TABS tablet Take 5 mg by mouth 2 (two) times daily.     gabapentin (NEURONTIN) 300 MG capsule Take 300 mg by mouth 3 (three) times daily.     Multiple Vitamin (MULTIVITAMIN) tablet Take 1 tablet by mouth daily.     rosuvastatin (CRESTOR) 5 MG tablet Take 5 mg by mouth daily.     triamcinolone ointment (KENALOG) 0.5 % Apply 1 Application topically daily as needed (rash).     baclofen (LIORESAL) 10 MG tablet Take 1 tablet (10 mg total) by mouth 3 (three) times daily as needed for muscle spasms. (Patient not taking: Reported on 10/11/2023) 10 each 0   cyclobenzaprine (FLEXERIL) 10 MG tablet Take 1 tablet (10 mg total) by mouth 2 (two) times daily as needed for muscle spasms. (Patient not taking: Reported on 10/11/2023) 10 tablet 0   dextromethorphan-guaiFENesin (MUCINEX DM) 30-600 MG 12hr tablet Take 1 tablet by mouth 2 (two) times daily. (Patient not taking: Reported on 10/11/2023) 14 tablet 0  No results found for this or any previous visit (from the past 48 hour(s)). No results found.  ROS All pertinent positives and negatives are listed HPI above  Blood pressure (!) 148/81, pulse (!) 59, temperature 98.1 F (36.7 C), resp. rate 17, height 5\' 6"  (1.676 m), weight 98.4 kg, SpO2 96%. 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  We discussed all risks, benefits and expected outcomes.  Informed consent was obtained and witnessed.  He had failed multiple conservative measures.  Medical clearance obtained.  Thank you for allowing me to participate in this patient's care.  Please do not hesitate to call with questions or concerns.   Monia Pouch, DO Neurosurgeon [facility] [phone]

## 2023-10-17 NOTE — Anesthesia Procedure Notes (Signed)
Procedure Name: Intubation Date/Time: 10/17/2023 10:28 AM  Performed by: Thomasene Ripple, CRNAPre-anesthesia Checklist: Patient identified, Emergency Drugs available, Suction available and Patient being monitored Patient Re-evaluated:Patient Re-evaluated prior to induction Oxygen Delivery Method: Circle System Utilized Preoxygenation: Pre-oxygenation with 100% oxygen Induction Type: IV induction Ventilation: Mask ventilation without difficulty Laryngoscope Size: Glidescope and 4 Grade View: Grade I Tube type: Oral Tube size: 8.0 mm Number of attempts: 1 Airway Equipment and Method: Stylet and Oral airway Placement Confirmation: ETT inserted through vocal cords under direct vision, positive ETCO2 and breath sounds checked- equal and bilateral Secured at: 23 cm Tube secured with: Tape Dental Injury: Teeth and Oropharynx as per pre-operative assessment

## 2023-10-17 NOTE — Op Note (Signed)
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 Kendell Bane C. Laryah Neuser, DO   ASSISTANT: Patrici Ranks, PA   ANESTHESIA: General Endotracheal   EBL: 100 cc   SPECIMENS: None   DRAINS: Medium Hemovac, subfascial space   COMPLICATIONS: None   CONDITION: Hemodynamically stable   HISTORY: Isaiah Todd is a 72 y.o. male with progressive complaints of bilateral left greater than right radiculopathy and slight left lower extremity weakness.  He went under multiple conservative measures without any longstanding improvement.  MRI revealed multifactorial lumbar stenosis that was moderate at L2-3 due to ligamentum and facet hypertrophy and broad-based disc bulging, moderate to severe at L3-4 and L4-5 due to similar pathology.  He underwent multiple conservative measures again without longstanding improvement therefore offered him open lumbar decompression L2-5, with bilateral laminectomies and medial facetectomies.  We discussed all risks, benefits and expected outcomes as well as alternatives to treatment.  Informed consent was obtained and witnessed.  Clearance was obtained, he was bridged to Lovenox preoperatively off of his Eliquis.   PROCEDURE IN DETAIL: The patient was brought to the operating room. After induction of general anesthesia, the patient was positioned on the operative table in the prone position. All pressure points were meticulously padded. Skin incision was then marked out and prepped and draped in the usual sterile fashion.   Using a 10 blade, midline incision was created over the L2-5 spinous processes.  Using Bovie electrocautery soft  tissue dissection was performed down to the lumbodorsal fascia.  Subperiosteal dissection was performed bilaterally with Bovie electrocautery exposing the L2, L3, L4, L5 lamina and medial facets bilaterally.  Deep retractors placed in the wound.  Lateral fluoroscopy confirmed the appropriate level.  The microscope was sterilely draped and brought into the field.    Using Leksell rongeur, the spinous process of L2, L3 were removed down to the lamina.  Using a high-speed drill, the L2 lamina was removed bilaterally to the lateral recess down to the ligamentum flavum and superiorly up to the ligamentous attachment bilaterally.  Using high-speed drill, a partial bilateral facetectomy was performed down to the ligamentum flavum.  The superior portion of the L3 lamina was then removed to the epidural space with the high speed drill. The ligamentum flavum was then gently dissected from the epidural space and resected with a series of Kerrison rongeurs to the lateral recess bilaterally.  Using a ball-tipped probe, bilateral lateral recesses appeared decompressed.  The bilateral lateral recess were explored again with a ball-tipped probe and noted to be appropriately decompressed.  The thecal sac was pulsatile.  Bilateral foramen were also palpated with a ball-tipped probe and noted to be adequately decompressed.  Epidural hemostasis was achieved with Surgifoam.  Using Leksell rongeur, the spinous process of L4 were removed down to the lamina.  Using a high-speed drill, the L3 lamina was removed bilaterally to the lateral recess down to the ligamentum flavum and superiorly up to the ligamentous attachment bilaterally.  Using high-speed drill, a partial bilateral facetectomy was performed down to the ligamentum flavum.  The superior portion of the L4 lamina was then removed to the epidural space with the high speed drill. The ligamentum flavum was then gently  dissected from the epidural space and resected with a series of  Kerrison rongeurs to the lateral recess bilaterally.  Using a ball-tipped probe, bilateral lateral recesses appeared decompressed.  The bilateral lateral recess were explored again with a ball-tipped probe and noted to be appropriately decompressed.  The thecal sac was pulsatile.  Bilateral foramen were also palpated with a ball-tipped probe and noted to be adequately decompressed.  Epidural hemostasis was achieved with Surgifoam.  Using Leksell rongeur, the spinous process of L5 were removed down to the lamina.  Using a high-speed drill, the L4 lamina was removed bilaterally to the lateral recess down to the ligamentum flavum and superiorly up to the ligamentous attachment bilaterally.  Using high-speed drill, a partial bilateral facetectomy was performed down to the ligamentum flavum.  The superior portion of the L5 lamina was then removed to the epidural space with the high speed drill. The ligamentum flavum was then gently dissected from the epidural space and resected with a series of Kerrison rongeurs to the lateral recess bilaterally.  Using a ball-tipped probe, bilateral lateral recesses appeared decompressed.  The bilateral lateral recess were explored again with a ball-tipped probe and noted to be appropriately decompressed.  The thecal sac was pulsatile.  Bilateral foramen were also palpated with a ball-tipped probe and noted to be adequately decompressed.  Epidural hemostasis was achieved with Surgifoam.  A mixture of Depo-Medrol and fentanyl was placed in the epidural space with Avitene. Deep retractor was taken out of the wound.  Hemostasis was achieved with bipolar cautery and the soft tissues.  The wound was closed in layers with 0 Vicryl sutures for muscle and fascia.  Dermis was closed with 2-0 and 3-0 Vicryl sutures.  Skin was closed with skin glue.  Sterile dressing was applied.   At the end of the case all sponge, needle, and instrument counts were correct. The patient was then transferred  to the stretcher, extubated, and taken to the post-anesthesia care unit in stable hemodynamic condition.

## 2023-10-17 NOTE — Plan of Care (Signed)

## 2023-10-17 NOTE — Transfer of Care (Signed)
Immediate Anesthesia Transfer of Care Note  Patient: WIL BLEE  Procedure(s) Performed: OPEN LUMBAR LAMINECTOMY AND MEDIAL FACETECTOMIES LUMBAR TWO-THREE, LUMBAR THREE-FOUR, LUMBAR FOUR-FIVE  Patient Location: PACU  Anesthesia Type:General  Level of Consciousness: awake, alert , and oriented  Airway & Oxygen Therapy: Patient Spontanous Breathing and Patient connected to face mask oxygen  Post-op Assessment: Report given to RN and Post -op Vital signs reviewed and stable  Post vital signs: Reviewed and stable  Last Vitals:  Vitals Value Taken Time  BP 109/70 10/17/23 1315  Temp 36.4 C 10/17/23 1300  Pulse 60 10/17/23 1317  Resp 12 10/17/23 1317  SpO2 97 % 10/17/23 1317  Vitals shown include unfiled device data.  Last Pain:  Vitals:   10/17/23 1300  PainSc: 0-No pain         Complications: No notable events documented.

## 2023-10-18 ENCOUNTER — Other Ambulatory Visit (HOSPITAL_COMMUNITY): Payer: Self-pay

## 2023-10-18 ENCOUNTER — Encounter (HOSPITAL_COMMUNITY): Payer: Self-pay | Admitting: Neurological Surgery

## 2023-10-18 DIAGNOSIS — M48061 Spinal stenosis, lumbar region without neurogenic claudication: Secondary | ICD-10-CM | POA: Diagnosis not present

## 2023-10-18 MED ORDER — CYCLOBENZAPRINE HCL 5 MG PO TABS
5.0000 mg | ORAL_TABLET | Freq: Three times a day (TID) | ORAL | 1 refills | Status: AC | PRN
  Filled 2023-10-18: qty 45, 15d supply, fill #0

## 2023-10-18 MED ORDER — OXYCODONE HCL 5 MG PO TABS
5.0000 mg | ORAL_TABLET | ORAL | 0 refills | Status: AC | PRN
  Filled 2023-10-18: qty 30, 5d supply, fill #0

## 2023-10-18 MED ORDER — ENOXAPARIN SODIUM 40 MG/0.4ML IJ SOSY
40.0000 mg | PREFILLED_SYRINGE | INTRAMUSCULAR | 0 refills | Status: AC
  Filled 2023-10-18: qty 2, 5d supply, fill #0

## 2023-10-18 MED ORDER — ELIQUIS 5 MG PO TABS: 5.0000 mg | ORAL_TABLET | Freq: Two times a day (BID) | ORAL | Status: AC

## 2023-10-18 NOTE — Progress Notes (Signed)
PT Cancellation Note and Discharge  Patient Details Name: Isaiah Todd MRN: [id number] DOB: 04-27-1951   Cancelled Treatment:    Reason Eval/Treat Not Completed: PT screened, no needs identified, will sign off. Discussed pt case with OT who reports pt is currently mobilizing at a modified independent level and does not require a formal PT evaluation at this time. Observed pt ambulating in hall without assistance. PT signing off. If needs change, please reconsult.     Marylynn Pearson 10/18/2023, 9:45 AM  Conni Slipper, PT, DPT Acute Rehabilitation Services Secure Chat Preferred Office: [phone]

## 2023-10-18 NOTE — Evaluation (Signed)
Occupational Therapy Evaluation Patient Details Name: Isaiah Todd MRN: [id number] DOB: 01-06-1951 Today's Date: 10/18/2023   History of Present Illness Isaiah Todd is an 72 y.o. male who underwent L2-5 open laminectomies, medial facetectomies 12/10. PMHx: arthritis, cancer, DVT, lymphedema, PNA   Clinical Impression   Isaiah Todd was evaluated s/p the above spine surgery. He is indep at baseline. Upon evaluation pt was limited by surgical pain, spinal precautions and limited activity tolerance. Overall he demonstrated mod I ability to competed ADLs and mobility with RW. Provided cues and education on spinal precautions and compensatory techniques throughout, handout provided and pt demonstrated great recall during ADLs and mobility. Pt does not require further acute OT services. Recommend d/c home with support of family.         If plan is discharge home, recommend the following: Assistance with cooking/housework;Assist for transportation    Functional Status Assessment  Patient has had a recent decline in their functional status and demonstrates the ability to make significant improvements in function in a reasonable and predictable amount of time.  Equipment Recommendations  None recommended by OT       Precautions / Restrictions Precautions Precautions: Fall;Back Precaution Booklet Issued: Yes (comment) Required Braces or Orthoses:  (No brace needed.) Restrictions Weight Bearing Restrictions: No      Mobility Bed Mobility Overal bed mobility: Modified Independent             General bed mobility comments: with log roll    Transfers Overall transfer level: Modified independent Equipment used: Rolling walker (2 wheels)                      Balance Overall balance assessment: Needs assistance Sitting-balance support: Feet supported Sitting balance-Leahy Scale: Good     Standing balance support: Single extremity supported, During functional  activity Standing balance-Leahy Scale: Good                             ADL either performed or assessed with clinical judgement   ADL Overall ADL's : Modified independent             General ADL Comments: after review of back precautions pt demonstrated mod I ability to complete ADLs     Vision Baseline Vision/History: 1 Wears glasses Vision Assessment?: No apparent visual deficits     Perception Perception: Within Functional Limits       Praxis Praxis: WFL       Pertinent Vitals/Pain Pain Assessment Pain Assessment: Faces Faces Pain Scale: Hurts a little bit Pain Location: surgical site Pain Descriptors / Indicators: Discomfort Pain Intervention(s): Limited activity within patient's tolerance, Monitored during session     Extremity/Trunk Assessment Upper Extremity Assessment Upper Extremity Assessment: Overall WFL for tasks assessed   Lower Extremity Assessment Lower Extremity Assessment: Overall WFL for tasks assessed   Cervical / Trunk Assessment Cervical / Trunk Assessment: Back Surgery   Communication Communication Communication: No apparent difficulties   Cognition Arousal: Alert Behavior During Therapy: WFL for tasks assessed/performed Overall Cognitive Status: Within Functional Limits for tasks assessed           General Comments  VSS.     Home Living Family/patient expects to be discharged to:: Private residence Living Arrangements: Spouse/significant other Available Help at Discharge: Family;Available 24 hours/day Type of Home: House Home Access: Stairs to enter [employer] of Steps: 3   Home Layout: One level  Bathroom Shower/Tub: Producer, television/film/video: Standard     Home Equipment: Agricultural consultant (2 wheels);Shower seat;Crutches          Prior Functioning/Environment Prior Level of Function : Independent/Modified Independent             Mobility Comments: no AD ADLs Comments:  indep        OT Problem List: Decreased range of motion;Decreased strength;Decreased activity tolerance;Impaired balance (sitting and/or standing);Decreased cognition;Decreased safety awareness;Decreased knowledge of use of DME or AE;Decreased knowledge of precautions         OT Goals(Current goals can be found in the care plan section) Acute Rehab OT Goals Patient Stated Goal: home OT Goal Formulation: With patient Time For Goal Achievement: 10/18/23 Potential to Achieve Goals: Good   AM-PAC OT "6 Clicks" Daily Activity     Outcome Measure Help from another person eating meals?: None Help from another person taking care of personal grooming?: None Help from another person toileting, which includes using toliet, bedpan, or urinal?: None Help from another person bathing (including washing, rinsing, drying)?: None Help from another person to put on and taking off regular upper body clothing?: None Help from another person to put on and taking off regular lower body clothing?: None 6 Click Score: 24   End of Session Equipment Utilized During Treatment: Rolling walker (2 wheels) Nurse Communication: Mobility status  Activity Tolerance: Patient tolerated treatment well Patient left: in bed;with call bell/phone within reach  OT Visit Diagnosis: Unsteadiness on feet (R26.81);Other abnormalities of gait and mobility (R26.89);Muscle weakness (generalized) (M62.81);Pain                Time: 5784-6962 OT Time Calculation (min): 15 min Charges:  OT General Charges $OT Visit: 1 Visit OT Evaluation $OT Eval Low Complexity: 1 Low  Derenda Mis, OTR/L Acute Rehabilitation Services Office [phone] Secure Chat Communication Preferred   Donia Pounds 10/18/2023, 9:10 AM

## 2023-10-18 NOTE — Discharge Summary (Signed)
   Physician Discharge Summary  Patient ID: DAGIM TOUSIGNANT MRN: [id number] DOB/AGE: 03/23/1951 72 y.o.  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 male underwent an elective L2-5 open laminectomy for decompression.  He tolerated the surgery well.  Postoperatively he was monitored overnight, his pain was controlled with oral medication upon discharge.  His incision was clean dry and intact, Hemovac removed without difficulty.  He was ambulating independently, having normal bowel bladder function upon discharge.  Treatments: Surgery -L2-5 laminectomies, medial facetectomies  Discharge Exam: Blood pressure 104/60, pulse 64, temperature 97.9 F (36.6 C), temperature source Oral, resp. rate 20, height 5\' 6"  (1.676 m), weight 98.4 kg, SpO2 98%. Awake, alert, oriented x 3 Speech fluent, appropriate CN grossly intact 5/5 BUE/BLE Wound c/d/I   Disposition: Discharge disposition: 01-Home or Self Care       Discharge Instructions     Incentive spirometry RT   Complete by: As directed       Allergies as of 10/18/2023   No Known Allergies      Medication List     STOP taking these medications    baclofen 10 MG tablet Commonly known as: LIORESAL   dextromethorphan-guaiFENesin 30-600 MG 12hr tablet Commonly known as: MUCINEX DM       TAKE these medications    cyclobenzaprine 5 MG tablet Commonly known as: FLEXERIL Take 1 tablet (5 mg total) by mouth 3 (three) times daily as needed for muscle spasms. What changed:  medication strength how much to take when to take this   Eliquis 5 MG Tabs tablet Generic drug: apixaban Take 1 tablet (5 mg total) by mouth 2 (two) times daily. Start taking on: October 24, 2023 What changed: These  instructions start on October 24, 2023. If you are unsure what to do until then, ask your doctor or other care provider.   enoxaparin 40 MG/0.4ML injection Commonly known as: LOVENOX Inject 0.4 mLs (40 mg total) into the skin daily for 5 days.   gabapentin 300 MG capsule Commonly known as: NEURONTIN Take 300 mg by mouth 3 (three) times daily.   multivitamin tablet Take 1 tablet by mouth daily.   mupirocin ointment 2 % Commonly known as: BACTROBAN Place 1 Application into the nose 2 (two) times daily for 60 doses. Use as directed 2 times daily for 5 days every other week for 6 weeks.   oxyCODONE 5 MG immediate release tablet Commonly known as: Oxy IR/ROXICODONE Take 1 tablet (5 mg total) by mouth every 4 (four) hours as needed for moderate pain (pain score 4-6).   rosuvastatin 5 MG tablet Commonly known as: CRESTOR Take 5 mg by mouth daily.   triamcinolone ointment 0.5 % Commonly known as: KENALOG Apply 1 Application topically daily as needed (rash).        Follow-up Information     Torrie Namba C, DO Follow up in 3 week(s).   Why: as scheduled Contact information: [address] [place] [address] [phone]                 Signed: Alan Mulder Darl Kuss 10/18/2023, 8:45 AM

## 2023-10-18 NOTE — Plan of Care (Signed)
  Problem: Education: Goal: Knowledge of General Education information will improve Description: Including pain rating scale, medication(s)/side effects and non-pharmacologic comfort measures Outcome: Completed/Met   Problem: Health Behavior/Discharge Planning: Goal: Ability to manage health-related needs will improve Outcome: Completed/Met   Problem: Clinical Measurements: Goal: Ability to maintain clinical measurements within normal limits will improve Outcome: Completed/Met Goal: Will remain free from infection Outcome: Completed/Met Goal: Diagnostic test results will improve Outcome: Completed/Met Goal: Respiratory complications will improve Outcome: Completed/Met Goal: Cardiovascular complication will be avoided Outcome: Completed/Met   Problem: Activity: Goal: Risk for activity intolerance will decrease Outcome: Completed/Met   Problem: Nutrition: Goal: Adequate nutrition will be maintained Outcome: Completed/Met   Problem: Coping: Goal: Level of anxiety will decrease Outcome: Completed/Met   Problem: Elimination: Goal: Will not experience complications related to bowel motility Outcome: Completed/Met Goal: Will not experience complications related to urinary retention Outcome: Completed/Met   Problem: Pain Management: Goal: General experience of comfort will improve Outcome: Completed/Met   Problem: Skin Integrity: Goal: Risk for impaired skin integrity will decrease Outcome: Completed/Met   Problem: Education: Goal: Ability to verbalize activity precautions or restrictions will improve Outcome: Completed/Met Goal: Knowledge of the prescribed therapeutic regimen will improve Outcome: Completed/Met Goal: Understanding of discharge needs will improve Outcome: Completed/Met   Problem: Activity: Goal: Ability to avoid complications of mobility impairment will improve Outcome: Completed/Met Goal: Ability to tolerate increased activity will improve Outcome:  Completed/Met Goal: Will remain free from falls Outcome: Completed/Met   Problem: Pain Management: Goal: Pain level will decrease Outcome: Completed/Met   Problem: Skin Integrity: Goal: Will show signs of wound healing Outcome: Completed/Met

## 2023-10-18 NOTE — Progress Notes (Signed)
Patient alert and oriented, void, ambulate. D/c instructions explain and given to the patient. Surgical site clean and dry no sign of infection. Patient d/c home per order.

## 2023-10-18 NOTE — Anesthesia Postprocedure Evaluation (Signed)
Anesthesia Post Note  Patient: Naod Ruane Franssen  Procedure(s) Performed: OPEN LUMBAR LAMINECTOMY AND MEDIAL FACETECTOMIES LUMBAR TWO-THREE, LUMBAR THREE-FOUR, LUMBAR FOUR-FIVE     Patient location during evaluation: PACU Anesthesia Type: General Level of consciousness: awake Pain management: pain level controlled Vital Signs Assessment: post-procedure vital signs reviewed and stable Respiratory status: spontaneous breathing Cardiovascular status: stable Postop Assessment: no apparent nausea or vomiting Anesthetic complications: no  No notable events documented.  Last Vitals:  Vitals:   10/18/23 0358 10/18/23 0818  BP: 109/63 104/60  Pulse: (!) 57 64  Resp: 18 20  Temp: 36.9 C 36.6 C  SpO2: 99% 98%    Last Pain:  Vitals:   10/18/23 0820  TempSrc:   PainSc: 0-No pain                 Caren Macadam

## 2023-10-21 DIAGNOSIS — R69 Illness, unspecified: Secondary | ICD-10-CM | POA: Diagnosis not present

## 2023-10-22 DIAGNOSIS — R69 Illness, unspecified: Secondary | ICD-10-CM | POA: Diagnosis not present

## 2023-10-24 DIAGNOSIS — R69 Illness, unspecified: Secondary | ICD-10-CM | POA: Diagnosis not present

## 2023-11-21 DIAGNOSIS — K080 Exfoliation of teeth due to systemic causes: Secondary | ICD-10-CM | POA: Diagnosis not present

## 2024-03-19 DIAGNOSIS — K080 Exfoliation of teeth due to systemic causes: Secondary | ICD-10-CM | POA: Diagnosis not present

## 2024-04-23 DIAGNOSIS — L821 Other seborrheic keratosis: Secondary | ICD-10-CM | POA: Diagnosis not present

## 2024-04-23 DIAGNOSIS — L309 Dermatitis, unspecified: Secondary | ICD-10-CM | POA: Diagnosis not present

## 2024-04-23 DIAGNOSIS — L570 Actinic keratosis: Secondary | ICD-10-CM | POA: Diagnosis not present

## 2024-04-23 DIAGNOSIS — B351 Tinea unguium: Secondary | ICD-10-CM | POA: Diagnosis not present

## 2024-04-23 DIAGNOSIS — B078 Other viral warts: Secondary | ICD-10-CM | POA: Diagnosis not present

## 2024-04-23 DIAGNOSIS — L814 Other melanin hyperpigmentation: Secondary | ICD-10-CM | POA: Diagnosis not present

## 2024-07-10 DIAGNOSIS — H524 Presbyopia: Secondary | ICD-10-CM | POA: Diagnosis not present

## 2024-07-16 DIAGNOSIS — K080 Exfoliation of teeth due to systemic causes: Secondary | ICD-10-CM | POA: Diagnosis not present

## 2024-08-26 DIAGNOSIS — R399 Unspecified symptoms and signs involving the genitourinary system: Secondary | ICD-10-CM | POA: Diagnosis not present

## 2024-08-26 DIAGNOSIS — Z960 Presence of urogenital implants: Secondary | ICD-10-CM | POA: Diagnosis not present

## 2024-08-26 DIAGNOSIS — N393 Stress incontinence (female) (male): Secondary | ICD-10-CM | POA: Diagnosis not present

## 2024-08-26 DIAGNOSIS — N5231 Erectile dysfunction following radical prostatectomy: Secondary | ICD-10-CM | POA: Diagnosis not present

## 2024-09-03 DIAGNOSIS — Z8546 Personal history of malignant neoplasm of prostate: Secondary | ICD-10-CM | POA: Diagnosis not present

## 2024-09-03 DIAGNOSIS — Z79899 Other long term (current) drug therapy: Secondary | ICD-10-CM | POA: Diagnosis not present

## 2024-09-03 DIAGNOSIS — R3129 Other microscopic hematuria: Secondary | ICD-10-CM | POA: Diagnosis not present

## 2024-09-03 DIAGNOSIS — I872 Venous insufficiency (chronic) (peripheral): Secondary | ICD-10-CM | POA: Diagnosis not present

## 2024-09-03 DIAGNOSIS — Z0000 Encounter for general adult medical examination without abnormal findings: Secondary | ICD-10-CM | POA: Diagnosis not present

## 2024-09-03 DIAGNOSIS — Z23 Encounter for immunization: Secondary | ICD-10-CM | POA: Diagnosis not present

## 2024-09-03 DIAGNOSIS — D7589 Other specified diseases of blood and blood-forming organs: Secondary | ICD-10-CM | POA: Diagnosis not present

## 2024-09-03 DIAGNOSIS — E7800 Pure hypercholesterolemia, unspecified: Secondary | ICD-10-CM | POA: Diagnosis not present

## 2024-09-03 DIAGNOSIS — R600 Localized edema: Secondary | ICD-10-CM | POA: Diagnosis not present

## 2024-09-03 DIAGNOSIS — E559 Vitamin D deficiency, unspecified: Secondary | ICD-10-CM | POA: Diagnosis not present

## 2024-10-11 ENCOUNTER — Other Ambulatory Visit: Payer: Self-pay | Admitting: Surgery

## 2024-11-27 ENCOUNTER — Other Ambulatory Visit: Payer: Self-pay | Admitting: Surgery

## 2024-11-27 DIAGNOSIS — M5416 Radiculopathy, lumbar region: Secondary | ICD-10-CM

## 2024-12-15 ENCOUNTER — Other Ambulatory Visit
